# Patient Record
Sex: Female | Born: 1970
Health system: Southern US, Community
[De-identification: ages and names within clinical notes are randomized; demographics above are authoritative.]

## PROBLEM LIST (undated history)

## (undated) DIAGNOSIS — R519 Headache, unspecified: Secondary | ICD-10-CM

## (undated) DIAGNOSIS — F329 Major depressive disorder, single episode, unspecified: Secondary | ICD-10-CM

## (undated) DIAGNOSIS — Z8669 Personal history of other diseases of the nervous system and sense organs: Secondary | ICD-10-CM

## (undated) DIAGNOSIS — F32A Depression, unspecified: Secondary | ICD-10-CM

## (undated) DIAGNOSIS — F419 Anxiety disorder, unspecified: Secondary | ICD-10-CM

## (undated) DIAGNOSIS — N390 Urinary tract infection, site not specified: Secondary | ICD-10-CM

## (undated) DIAGNOSIS — G43909 Migraine, unspecified, not intractable, without status migrainosus: Secondary | ICD-10-CM

## (undated) DIAGNOSIS — R51 Headache: Secondary | ICD-10-CM

## (undated) HISTORY — DX: Headache, unspecified: R51.9

## (undated) HISTORY — DX: Headache: R51

## (undated) HISTORY — DX: Migraine, unspecified, not intractable, without status migrainosus: G43.909

## (undated) HISTORY — DX: Urinary tract infection, site not specified: N39.0

---

## 2006-09-26 ENCOUNTER — Encounter: Payer: Self-pay | Admitting: Maternal & Fetal Medicine

## 2006-11-21 ENCOUNTER — Encounter: Payer: Self-pay | Admitting: Maternal & Fetal Medicine

## 2007-02-23 ENCOUNTER — Encounter: Payer: Self-pay | Admitting: Maternal & Fetal Medicine

## 2007-04-10 ENCOUNTER — Inpatient Hospital Stay: Payer: Self-pay | Admitting: Obstetrics and Gynecology

## 2007-04-20 ENCOUNTER — Encounter: Payer: Self-pay | Admitting: Maternal & Fetal Medicine

## 2007-04-24 ENCOUNTER — Encounter: Payer: Self-pay | Admitting: Maternal & Fetal Medicine

## 2007-05-01 ENCOUNTER — Encounter: Payer: Self-pay | Admitting: Maternal & Fetal Medicine

## 2007-05-11 ENCOUNTER — Encounter: Payer: Self-pay | Admitting: Maternal & Fetal Medicine

## 2007-07-19 ENCOUNTER — Ambulatory Visit: Payer: Self-pay | Admitting: Obstetrics and Gynecology

## 2007-09-14 ENCOUNTER — Ambulatory Visit: Payer: Self-pay | Admitting: Obstetrics and Gynecology

## 2007-10-11 ENCOUNTER — Ambulatory Visit: Payer: Self-pay | Admitting: Obstetrics & Gynecology

## 2008-07-03 ENCOUNTER — Ambulatory Visit: Payer: Self-pay | Admitting: Family Medicine

## 2008-07-03 LAB — CONVERTED CEMR LAB
Basophils Absolute: 0 10*3/uL (ref 0.0–0.1)
Basophils Relative: 0 % (ref 0–1)
Hemoglobin: 12 g/dL (ref 12.0–15.0)
Lymphocytes Relative: 26 % (ref 12–46)
Lymphs Abs: 2 10*3/uL (ref 0.7–4.0)
MCHC: 32.3 g/dL (ref 30.0–36.0)
MCV: 82.5 fL (ref 78.0–100.0)
Monocytes Absolute: 0.6 10*3/uL (ref 0.1–1.0)
Monocytes Relative: 7 % (ref 3–12)
Platelets: 265 10*3/uL (ref 150–400)
RBC: 4.51 M/uL (ref 3.87–5.11)
RDW: 14.8 % (ref 11.5–15.5)
Rh Type: POSITIVE
Rubella: 48.1 intl units/mL — ABNORMAL HIGH

## 2008-07-08 ENCOUNTER — Ambulatory Visit: Payer: Self-pay | Admitting: Obstetrics and Gynecology

## 2008-07-11 ENCOUNTER — Ambulatory Visit: Payer: Self-pay | Admitting: Obstetrics and Gynecology

## 2008-07-17 ENCOUNTER — Ambulatory Visit: Payer: Self-pay | Admitting: Obstetrics and Gynecology

## 2008-07-17 ENCOUNTER — Encounter: Payer: Self-pay | Admitting: Family Medicine

## 2008-07-17 LAB — CONVERTED CEMR LAB: Chloride: 107 meq/L (ref 96–112)

## 2008-07-22 ENCOUNTER — Ambulatory Visit (HOSPITAL_COMMUNITY): Admission: RE | Admit: 2008-07-22 | Discharge: 2008-07-22 | Payer: Self-pay | Admitting: Obstetrics & Gynecology

## 2008-07-23 ENCOUNTER — Encounter: Payer: Self-pay | Admitting: Obstetrics & Gynecology

## 2008-07-23 ENCOUNTER — Ambulatory Visit: Payer: Self-pay | Admitting: Obstetrics & Gynecology

## 2008-07-24 ENCOUNTER — Encounter: Payer: Self-pay | Admitting: Family Medicine

## 2008-08-13 ENCOUNTER — Ambulatory Visit: Payer: Self-pay | Admitting: Family Medicine

## 2008-09-10 ENCOUNTER — Ambulatory Visit: Payer: Self-pay | Admitting: Obstetrics and Gynecology

## 2008-10-08 ENCOUNTER — Ambulatory Visit: Payer: Self-pay | Admitting: Obstetrics & Gynecology

## 2008-10-11 ENCOUNTER — Ambulatory Visit (HOSPITAL_COMMUNITY): Admission: RE | Admit: 2008-10-11 | Discharge: 2008-10-11 | Payer: Self-pay | Admitting: Obstetrics and Gynecology

## 2008-11-04 ENCOUNTER — Ambulatory Visit: Payer: Self-pay | Admitting: Obstetrics & Gynecology

## 2008-12-02 ENCOUNTER — Ambulatory Visit: Payer: Self-pay | Admitting: Obstetrics & Gynecology

## 2008-12-02 LAB — CONVERTED CEMR LAB
Hemoglobin: 9 g/dL — ABNORMAL LOW (ref 12.0–15.0)
Platelets: 214 10*3/uL (ref 150–400)
RDW: 16.2 % — ABNORMAL HIGH (ref 11.5–15.5)
WBC: 7.7 10*3/uL (ref 4.0–10.5)

## 2008-12-09 ENCOUNTER — Ambulatory Visit: Payer: Self-pay | Admitting: Obstetrics & Gynecology

## 2008-12-09 LAB — CONVERTED CEMR LAB
RBC Folate: 486 ng/mL (ref 180–600)
Vitamin B-12: 149 pg/mL — ABNORMAL LOW (ref 211–911)

## 2008-12-23 ENCOUNTER — Ambulatory Visit: Payer: Self-pay | Admitting: Obstetrics and Gynecology

## 2009-01-06 ENCOUNTER — Ambulatory Visit: Payer: Self-pay | Admitting: Obstetrics and Gynecology

## 2009-01-21 ENCOUNTER — Ambulatory Visit: Payer: Self-pay | Admitting: Family Medicine

## 2009-02-03 ENCOUNTER — Ambulatory Visit: Payer: Self-pay | Admitting: Obstetrics and Gynecology

## 2009-02-03 LAB — CONVERTED CEMR LAB
AST: 17 units/L (ref 0–37)
BUN: 16 mg/dL (ref 6–23)
CO2: 20 meq/L (ref 19–32)
Calcium: 8.5 mg/dL (ref 8.4–10.5)
Chlamydia, DNA Probe: NEGATIVE
Chloride: 109 meq/L (ref 96–112)
Creatinine, Ser: 0.87 mg/dL (ref 0.40–1.20)
GC Probe Amp, Genital: NEGATIVE
Glucose, Bld: 101 mg/dL — ABNORMAL HIGH (ref 70–99)
MCHC: 29.5 g/dL — ABNORMAL LOW (ref 30.0–36.0)
Potassium: 4.4 meq/L (ref 3.5–5.3)
Total Bilirubin: 0.3 mg/dL (ref 0.3–1.2)
Uric Acid, Serum: 8.6 mg/dL — ABNORMAL HIGH (ref 2.4–7.0)

## 2009-02-04 ENCOUNTER — Encounter: Payer: Self-pay | Admitting: Obstetrics and Gynecology

## 2009-02-04 LAB — CONVERTED CEMR LAB: Trich, Wet Prep: NONE SEEN

## 2009-02-06 ENCOUNTER — Ambulatory Visit: Payer: Self-pay | Admitting: Obstetrics & Gynecology

## 2009-02-06 ENCOUNTER — Encounter: Payer: Self-pay | Admitting: Obstetrics and Gynecology

## 2009-02-06 LAB — CONVERTED CEMR LAB
Collection Interval-CRCL: 24 hr
Creatinine 24 HR UR: 777 mg/24hr (ref 700–1800)
Creatinine Clearance: 62 mL/min — ABNORMAL LOW (ref 75–115)
Protein, Ur: 1678 mg/24hr — ABNORMAL HIGH (ref 50–100)

## 2009-02-07 ENCOUNTER — Inpatient Hospital Stay (HOSPITAL_COMMUNITY): Admission: AD | Admit: 2009-02-07 | Discharge: 2009-02-10 | Payer: Self-pay | Admitting: Obstetrics & Gynecology

## 2009-02-07 ENCOUNTER — Ambulatory Visit: Payer: Self-pay | Admitting: Physician Assistant

## 2009-02-13 ENCOUNTER — Telehealth: Payer: Self-pay | Admitting: Family Medicine

## 2009-03-25 ENCOUNTER — Ambulatory Visit: Payer: Self-pay | Admitting: Family Medicine

## 2009-05-27 ENCOUNTER — Ambulatory Visit: Payer: Self-pay | Admitting: Nurse Practitioner

## 2009-08-26 ENCOUNTER — Ambulatory Visit: Payer: Self-pay | Admitting: Nurse Practitioner

## 2009-08-26 ENCOUNTER — Encounter: Payer: Self-pay | Admitting: Obstetrics and Gynecology

## 2009-08-26 LAB — CONVERTED CEMR LAB
HCT: 33.5 % — ABNORMAL LOW (ref 36.0–46.0)
MCHC: 29 g/dL — ABNORMAL LOW (ref 30.0–36.0)
MCV: 79.2 fL (ref 78.0–100.0)
RDW: 16.5 % — ABNORMAL HIGH (ref 11.5–15.5)

## 2010-08-13 LAB — COMPREHENSIVE METABOLIC PANEL
Albumin: 2.3 g/dL — ABNORMAL LOW (ref 3.5–5.2)
BUN: 13 mg/dL (ref 6–23)
CO2: 22 mEq/L (ref 19–32)
Calcium: 8.3 mg/dL — ABNORMAL LOW (ref 8.4–10.5)
Chloride: 109 mEq/L (ref 96–112)
GFR calc non Af Amer: >60 mL/min (ref >60)
Total Protein: 4.7 g/dL — ABNORMAL LOW (ref 6.0–8.3)

## 2010-08-13 LAB — CBC
HCT: 18.7 % — ABNORMAL LOW (ref 36.0–46.0)
HCT: 20 % — ABNORMAL LOW (ref 36.0–46.0)
Hemoglobin: 6 g/dL — CL (ref 12.0–15.0)
MCHC: 32 g/dL (ref 30.0–36.0)
MCHC: 32.2 g/dL (ref 30.0–36.0)
MCV: 74.9 fL — ABNORMAL LOW (ref 78.0–100.0)
Platelets: 141 10*3/uL — ABNORMAL LOW (ref 150–400)
Platelets: 151 10*3/uL (ref 150–400)
RBC: 2.47 MIL/uL — ABNORMAL LOW (ref 3.87–5.11)
RDW: 18.6 % — ABNORMAL HIGH (ref 11.5–15.5)
WBC: 10.2 10*3/uL (ref 4.0–10.5)
WBC: 8.9 10*3/uL (ref 4.0–10.5)

## 2010-08-13 LAB — RPR: RPR Ser Ql: NONREACTIVE

## 2010-09-01 ENCOUNTER — Ambulatory Visit (INDEPENDENT_AMBULATORY_CARE_PROVIDER_SITE_OTHER): Payer: BC Managed Care – PPO | Admitting: Obstetrics and Gynecology

## 2010-09-01 ENCOUNTER — Other Ambulatory Visit: Payer: Self-pay | Admitting: Obstetrics & Gynecology

## 2010-09-01 DIAGNOSIS — Z113 Encounter for screening for infections with a predominantly sexual mode of transmission: Secondary | ICD-10-CM

## 2010-09-01 DIAGNOSIS — N644 Mastodynia: Secondary | ICD-10-CM

## 2010-09-01 DIAGNOSIS — Z1272 Encounter for screening for malignant neoplasm of vagina: Secondary | ICD-10-CM

## 2010-09-01 DIAGNOSIS — Z01419 Encounter for gynecological examination (general) (routine) without abnormal findings: Secondary | ICD-10-CM

## 2010-09-02 NOTE — Assessment & Plan Note (Signed)
Whitney [name], Whitney [name]            ACCOUNT NO.:  [id number]  MEDICAL RECORD NO.:  [id number]           PATIENT TYPE:  LOCATION:  [facility] at [facility]           FACILITY:  PHYSICIAN:  Jaynie Collins, MD          DATE OF BIRTH:  DATE OF SERVICE:                                 CLINIC NOTE  REASON FOR VISIT:  Annual examination.  Whitney [name] is a 40 year old gravida 3, para 2-0-1-2 with last menstrual period of August 04, 2010 here today for her annual examination.  The patient is complaining of left breast tenderness for a few days.  No masses were palpated.  No abnormal nipple drainage or any other symptoms.  She does not have any gynecologic symptoms.  PAST OB/GYN HISTORY:  The patient has had 2 term vaginal deliveries and 1 TAB.  She did have a history of preeclampsia with her first pregnancy and her second pregnancy.  The patient does not have any history of sexually transmitted infections or cervical dysplasia.  Her last Pap smear was in March 2010.  PAST MEDICAL HISTORY:  Migraines with aura, depression.  PAST SURGICAL HISTORY:  TAB.  MEDICATIONS: 1. Zoloft 50 mg daily. 2. Relpax 40 mg daily. 3. Progesterone 2 weeks a month after her cycle. 4. B12, calcium, iron, and fish oil.  ALLERGIES:  No known drug allergies.  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  She denies any tobacco, alcohol, or illicit drug use.  FAMILY HISTORY:  Mother was diagnosed with breast cancer at age 78 and passed away at age 91.  Maternal grandmother passed away of stomach cancer.  Maternal aunts passed away of lung cancer.  No other family history of any other neoplasia or conditions.  REVIEW OF SYSTEMS:  Negative as mentioned above.  Of note, the patient is not on any current contraception as her husband did undergo a vasectomy after the birth of their second child.  PHYSICAL EXAMINATION:  VITALS:  Pulse 78, blood pressure 111/73, weight 134 pounds, height  5 feet 9 inches. GENERAL:  No apparent distress. HEENT:  Normocephalic, atraumatic. NECK:  Supple.  Normal thyroid. LUNGS:  Clear to auscultation bilaterally. HEART:  Regular rate and rhythm. BREASTS:  Symmetric in size, soft.  The patient does have some focal tenderness around 1 o'clock on the left breast.  No abnormal masses palpated.  No lymphadenopathy or abnormal nipple drainage.  Normal breast exam on the patient's right side. ABDOMEN:  Soft, nontender, nondistended. EXTREMITIES:  No cyanosis, clubbing, or edema. PELVIC:  Normal external female genitalia.  Pink and rugated vagina.  No lesions seen.  Normal cervix.  Pap smear was obtained.  On bimanual exam, the patient has normal-sized uterus, retroverted.  No abnormal adnexal masses or tenderness and no uterine tenderness.  ASSESSMENT/PLAN:  The patient is a 40 year old gravida 3, para 2-0-1-2 here for yearly examination.  A Pap smear was done and automatic HPV test will be done from a Pap smear sample.  Breast examination reveals focal tenderness on the patient's left side.  [facility] this breast pain.  The patient is otherwise without any  gynecologic complaints.  She was placed on a progesterone pills by [facility].  Because she was feeling fatigued and lightheaded, she has self discontinued that at this point because she said she started itching after taking a progesterone. As of now, there is no indication for any hormonal therapy and she was told to tell us if she had any abnormal bleeding or any other gynecologic concerns but as of now she does not have to take the progesterone pills.  The patient is okay with that.  She was told to call or come back in for any further gynecologic concerns.          ______________________________ Jaynie Collins, MD    UA/MEDQ  D:  09/01/2010  T:  09/02/2010  Job:  [id number]

## 2010-09-07 ENCOUNTER — Ambulatory Visit
Admission: RE | Admit: 2010-09-07 | Discharge: 2010-09-07 | Disposition: A | Discharge status: Home / Self Care | Payer: BC Managed Care – PPO | Source: Ambulatory Visit | Attending: Obstetrics & Gynecology | Admitting: Obstetrics & Gynecology

## 2010-09-07 ENCOUNTER — Other Ambulatory Visit: Payer: Self-pay | Admitting: Obstetrics & Gynecology

## 2010-09-07 DIAGNOSIS — N644 Mastodynia: Secondary | ICD-10-CM

## 2010-09-22 NOTE — Assessment & Plan Note (Signed)
NAMETERRIKA, Whitney [name]            ACCOUNT NO.:  [id number]   MEDICAL RECORD NO.:  [id number]          PATIENT TYPE:  POB   LOCATION:  [facility] at [facility]         FACILITY:  [facility]   PHYSICIAN:  Scheryl Darter, MD       DATE OF BIRTH:  [birth year]   DATE OF SERVICE:  05/27/2009                                  CLINIC NOTE   The patient comes to the office today for consultation with her migraine  headaches.  The patient is a well-known patient to this practice.  She  was diagnosed with migraines when she was 40 years old.  She does not  have aura with her migraines.  Her headaches are typically located in  her right temple, and on monthly period she has no severe headaches.  She has 4 moderates and 2 mild.  She is currently taking Zomig which  works very well for her.  Her issue is that her Zomig has gone up to 130  dollars per prescription which is unaffordable to her.  We did talk  about prevention.  She does not feel that her headaches warrant  prevention at this time.  The patient recently had a baby, she is about  67 months old.  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  Her life is rather busy and she is not  sleeping fully through the night.  She does feel that this increased  stress has caused her headaches to be somewhat more recently.   PHYSICAL EXAMINATION:  VITAL SIGNS:  Blood pressure is 113/69, pulse is  67, weight 141.  GENERAL:  Well-developed, well-nourished 40 year old Caucasian female in  no acute distress.  HEENT:  Head is normocephalic and atraumatic.  CARDIAC:  Regular rate and rhythm.  LUNGS:  Clear bilaterally.  NEUROLOGIC:  The patient is alert, oriented.  She is well coordinated.  She has good muscle strength, good muscle coordination, good reflexes.   ASSESSMENT:  Migraine without aura.   PLAN:  We basically discussed switching her triptan over either to  Imitrex or to Relpax.  She was concerned about the Imitrex  causing her  to have side effects.  She was given a prescription for Imitrex 100 mg 1  p.o. p.r.n. migraine #9 with p.r.n. refills.  If she does have side  effects, she was also given a prescription for Relpax 40 mg 1 p.o.  p.r.n. migraine #9 with p.r.n. refills as well as a sample of that.  She  can use which ever one does not give her significant side effects.  The  patient will return in 1 year or on a p.r.n. basis.      Remonia Richter, NP    ______________________________  Scheryl Darter, MD    LR/MEDQ  D:  05/27/2009  T:  05/28/2009  Job:  [id number]

## 2010-09-22 NOTE — Assessment & Plan Note (Signed)
Whitney [name], Whitney [name]            ACCOUNT NO.:  [id number]   MEDICAL RECORD NO.:  [id number]          PATIENT TYPE:  POB   LOCATION:  [facility] at [facility]         FACILITY:  [facility]   PHYSICIAN:  Tinnie Gens, MD        DATE OF BIRTH:  Dec 14, 1970   DATE OF SERVICE:  03/25/2009                                  CLINIC NOTE   CHIEF COMPLAINT:  Postpartum check.   HISTORY OF PRESENT ILLNESS:  The patient is a normal 40 year old gravida  3, para 2 who is 6 weeks postpartum from vaginal delivery of a 37-week  infant.  She was induced for preeclampsia.  Postpartum, her blood  pressures have been fine.  She has had increasing problems with  depression and sadness.  She has cried a lot.  She is having trouble  with sleeping as her baby is not sleeping very well at all, waking every  3 hours to feed.  She was nursing.  She is trying to transition to  bottles.  At this point she is interested in going back to work possibly  this week.  The patient is currently on Zoloft 50 mg p.o. daily and  wonders if this could be increased.  She also has very little interest  in sex at this time and wonders if the Zoloft is a contributing factor  to that.  Her husband has an appointment for vasectomy.  This has  already been scheduled.  They are going to use condoms.  She has not  been sexually active up to this point.   PHYSICAL EXAMINATION:  VITAL SIGNS:  As noted in the chart.  Blood  pressure is 111/73.  GENERAL:  She is a well-developed, well-nourished woman, in no acute  distress.  GU:  Normal external female genitalia.  BUS normal.  Vagina pink and  rugated.  Cervix is parous without lesion.  Uterus is small, anteverted.  No adnexal mass or tenderness.   IMPRESSION:  Normal postpartum check, postpartum depression.   PLAN:  1. Increase her Zoloft to 100 mg p.o. daily.  If she is not better in      6-8 weeks come back in.  2. If her sexual drive does not improve with improvement of sleep,  going back to work and improvement in mood, we would consider      changing to Wellbutrin.  3. Continue condoms until vasectomy is complete.  4. She is to return in March for a Pap smear.           ______________________________  Tinnie Gens, MD     TP/MEDQ  D:  03/25/2009  T:  03/26/2009  Job:  [id number]

## 2010-09-22 NOTE — Assessment & Plan Note (Signed)
[name], [name]            ACCOUNT NO.:  [id number]   MEDICAL RECORD NO.:  [id number]          PATIENT TYPE:  POB   LOCATION:  [facility] at [facility]         FACILITY:  [facility]   PHYSICIAN:  Jaynie Collins, MD     DATE OF BIRTH:  August 16, 1970   DATE OF SERVICE:  08/26/2009                                  CLINIC NOTE   The patient comes to the office today for her yearly physical exam.  The  patient denies any complaints other than being tired with a newborn and  a 39-year-old.  She is sleeping somewhat better now, the newborn is  sleeping through the night.  But that 39-year-old was off on up at night.  Otherwise; the patient is doing well.  The patient did have a severe  anemia in September 2010.  Her hemoglobin was 7.6 and her hematocrit was  25.8.  At that point, she was put on iron.  She has completed with the  iron tablets, Colace, and over-the-counter B12 that was prescribed.  She  has not had her blood repeated since then.   ALLERGIES:  No known allergies.   First day of the last menstrual period was March 27.  Last Pap smear was  July 23, 2008.   PHYSICAL EXAMINATION:  VITAL SIGNS:  Blood pressure is 105/65, weight is  139, height is 5 feet 9 inches, pulse is 64.  GENERAL:  Well-developed, well-nourished 40 year old Caucasian female,  in no acute distress.  HEENT:  Pupils equal and reactive.  Thyroid is not enlarged.  There are  no nodules present.  CARDIAC:  Regular rate and rhythm.  LUNGS:  Clear bilaterally.  BREASTS:  Symmetrical.  There is no skin dimpling.  There are no mass  appreciated.  ABDOMEN:  Soft, nontender.  No organomegaly.  GENITALIA:  Externally, there is a slight amount of redness on the  labia.  Vaginal, there is a small amount of white thick discharge.  Cervix is without lesion.  Bimanual exam, no cervical motion tenderness.  No adnexal mass.  EXTREMITIES:  Warm and dry.   ASSESSMENT:  Yearly physical exam.  The patient had a normal Pap smear  last year and has not had an abnormal in her lifetime.  We will skip  this year for Pap smear.  She declined STD screening.  She will have her  Zoloft 50 mg 1 p.o. daily #30 with p.r.n. refills, refilled.  We will  also check a CBC to ascertain her anemia status.  She will call the  office in 1 week if she has not heard from these results.  Otherwise;  she will come back in 1 year or as needed.      Whitney Richter, NP    ______________________________  Jaynie Collins, MD   LR/MEDQ  D:  08/26/2009  T:  08/26/2009  Job:  [id number]

## 2010-09-25 NOTE — Assessment & Plan Note (Signed)
Whitney [name], Whitney [name]            ACCOUNT NO.:  [id number]   MEDICAL RECORD NO.:  [id number]          PATIENT TYPE:  POB   LOCATION:  [facility] at [facility]         FACILITY:  [facility]   PHYSICIAN:  Allie Bossier, MD        DATE OF BIRTH:  07-Mar-1971   DATE OF SERVICE:                                  CLINIC NOTE   [name] is a 40 year old married white G2, P1, A84 with a 17-month-old  son named Educational psychologist.  She is here to establish Gynecology Care.  She is up-  to-date on her Pap smear as it was done in January of 2009.  She had an  ultrasound of her left breast last month as well.  She needs a refill on  her Zoloft and wanted to change doctors.   PAST MEDICAL HISTORY:  1. Depression.  2. Migraine.   REVIEW OF SYSTEMS:  She has been married for the last year.  She has  been monogamous for the last 5 years.  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  She uses condoms.  She has tried oral contraceptive  pills for 9 months postpartum, but had nausea with Yasmin and prefers  condoms.  Her periods are monthly, lasting about 5 days and without  excessive pain with regard to her left breast.  She has seen a surgeon  named Dr. Doristine Counter and had a normal ultrasound.   PAST SURGICAL HISTORY:  She had an elective AB turned out to [facility].   FAMILY HISTORY:  Positive for breast cancer but negative for GYN and  colon malignancies.   ALLERGIES:  She is not allergic to latex.  No know drug allergies.   SOCIAL HISTORY:  Negative for tobacco, alcohol, or drug use.   PHYSICAL EXAM:  Her weight is 141 pounds, height is 5 feet 8 inch, blood  pressure 112/70, and pulse is 65.  HEENT: Normal.  BREAST: She has bilateral fibrocystic changes, but there is no discrete  masses on either side.   ASSESSMENT/PLAN:  1. Establishment of care.  2. Depression.  TSH has not been checked for her, so I will do that.      I will continue her Zoloft at a 100 mg a day and consider weaning      in approximately 6  months.  With regard to her breast, I have      recommended annual mammograms and monthly self-breast exam.     Allie Bossier, MD    MCD/MEDQ  D:  10/19/2007  T:  10/19/2007  Job:  [phone]

## 2011-01-07 ENCOUNTER — Telehealth: Payer: Self-pay

## 2011-01-07 NOTE — Telephone Encounter (Signed)
PATIENT CALLED ABOUT HAVING HER RELPAX PRE AUTHORIZED. HER PHARMACY TOLD HER THAT HER INSURANCE ONLY APPROVES 4 PILLS AND THAT WE NEED TO APPEAL IT SO SHE CAN GET 8 PILLS INSTEAD. THIS IS THE PHONE # TO CALL [phone]. PLEASE CALL AS SOON AS THIS IS TAKEN CARE OFF.

## 2011-01-25 NOTE — Telephone Encounter (Signed)
Spoke with patients insurance and they stated that they would not prior authorize additional medication past the amount of four tablets.  The only way to get this done would be to write a letter of appeal.  Sent to [employer]   [address] [address]  [place]  [id number].  For her Relpax 40 mg

## 2012-08-28 ENCOUNTER — Ambulatory Visit: Payer: Self-pay | Admitting: Obstetrics and Gynecology

## 2013-02-28 ENCOUNTER — Ambulatory Visit: Payer: Self-pay | Admitting: Family Medicine

## 2013-09-18 ENCOUNTER — Ambulatory Visit: Payer: Self-pay | Admitting: Family Medicine

## 2013-12-24 LAB — HM PAP SMEAR: HM Pap smear: NORMAL

## 2014-08-28 ENCOUNTER — Emergency Department
Admit: 2014-08-28 | Disposition: A | Discharge status: Home / Self Care | Payer: Self-pay | Admitting: Emergency Medicine

## 2014-08-28 LAB — TROPONIN I: Troponin-I: <0.03 ng/mL

## 2014-08-28 LAB — CBC
HCT: 34.7 % — ABNORMAL LOW (ref 35.0–47.0)
HGB: 10.6 g/dL — ABNORMAL LOW (ref 12.0–16.0)
MCH: 23.4 pg — ABNORMAL LOW (ref 26.0–34.0)
MCHC: 30.5 g/dL — AB (ref 32.0–36.0)
MCV: 77 fL — ABNORMAL LOW (ref 80–100)
Platelet: 263 10*3/uL (ref 150–440)
RBC: 4.5 10*6/uL (ref 3.80–5.20)
RDW: 16.3 % — AB (ref 11.5–14.5)
WBC: 5.1 10*3/uL (ref 3.6–11.0)

## 2014-08-28 LAB — BASIC METABOLIC PANEL
Anion Gap: 7 (ref 7–16)
BUN: 20 mg/dL
CALCIUM: 9.1 mg/dL
CO2: 27 mmol/L
Chloride: 107 mmol/L
Creatinine: 0.61 mg/dL
GLUCOSE: 150 mg/dL — AB
POTASSIUM: 3.2 mmol/L — AB
Sodium: 141 mmol/L

## 2014-09-09 LAB — HM MAMMOGRAPHY: HM MAMMO: NORMAL

## 2014-12-08 ENCOUNTER — Emergency Department: Payer: BLUE CROSS/BLUE SHIELD

## 2014-12-08 ENCOUNTER — Emergency Department
Admission: EM | Admit: 2014-12-08 | Discharge: 2014-12-08 | Disposition: A | Discharge status: Home / Self Care | Payer: BLUE CROSS/BLUE SHIELD | Attending: Emergency Medicine | Admitting: Emergency Medicine

## 2014-12-08 ENCOUNTER — Encounter: Payer: Self-pay | Admitting: *Deleted

## 2014-12-08 DIAGNOSIS — Z3202 Encounter for pregnancy test, result negative: Secondary | ICD-10-CM | POA: Insufficient documentation

## 2014-12-08 DIAGNOSIS — R51 Headache: Secondary | ICD-10-CM | POA: Diagnosis not present

## 2014-12-08 DIAGNOSIS — R202 Paresthesia of skin: Secondary | ICD-10-CM | POA: Insufficient documentation

## 2014-12-08 DIAGNOSIS — F410 Panic disorder [episodic paroxysmal anxiety] without agoraphobia: Secondary | ICD-10-CM | POA: Diagnosis not present

## 2014-12-08 DIAGNOSIS — F419 Anxiety disorder, unspecified: Secondary | ICD-10-CM

## 2014-12-08 DIAGNOSIS — R42 Dizziness and giddiness: Secondary | ICD-10-CM | POA: Diagnosis present

## 2014-12-08 HISTORY — DX: Depression, unspecified: F32.A

## 2014-12-08 HISTORY — DX: Personal history of other diseases of the nervous system and sense organs: Z86.69

## 2014-12-08 HISTORY — DX: Major depressive disorder, single episode, unspecified: F32.9

## 2014-12-08 HISTORY — DX: Anxiety disorder, unspecified: F41.9

## 2014-12-08 LAB — CBC
HEMATOCRIT: 42.7 % (ref 35.0–47.0)
HEMOGLOBIN: 14 g/dL (ref 12.0–16.0)
MCH: 29.1 pg (ref 26.0–34.0)
MCHC: 32.9 g/dL (ref 32.0–36.0)
MCV: 88.4 fL (ref 80.0–100.0)
Platelets: 207 10*3/uL (ref 150–440)
RBC: 4.82 MIL/uL (ref 3.80–5.20)
RDW: 15.8 % — AB (ref 11.5–14.5)
WBC: 5.2 10*3/uL (ref 3.6–11.0)

## 2014-12-08 LAB — URINALYSIS COMPLETE WITH MICROSCOPIC (ARMC ONLY)
Bacteria, UA: NONE SEEN
Bilirubin Urine: NEGATIVE
Glucose, UA: NEGATIVE mg/dL
Ketones, ur: NEGATIVE mg/dL
NITRITE: NEGATIVE
PH: 5 (ref 5.0–8.0)
PROTEIN: NEGATIVE mg/dL
Specific Gravity, Urine: 1.015 (ref 1.005–1.030)

## 2014-12-08 LAB — COMPREHENSIVE METABOLIC PANEL
ALBUMIN: 4.2 g/dL (ref 3.5–5.0)
ALT: 10 U/L — AB (ref 14–54)
AST: 13 U/L — AB (ref 15–41)
Alkaline Phosphatase: 43 U/L (ref 38–126)
Anion gap: 6 (ref 5–15)
BILIRUBIN TOTAL: 0.8 mg/dL (ref 0.3–1.2)
BUN: 16 mg/dL (ref 6–20)
CO2: 25 mmol/L (ref 22–32)
Calcium: 8.9 mg/dL (ref 8.9–10.3)
Chloride: 107 mmol/L (ref 101–111)
Creatinine, Ser: 0.65 mg/dL (ref 0.44–1.00)
GFR calc Af Amer: >60 mL/min (ref >60)
Glucose, Bld: 108 mg/dL — ABNORMAL HIGH (ref 65–99)
Potassium: 3.8 mmol/L (ref 3.5–5.1)
Sodium: 138 mmol/L (ref 135–145)
Total Protein: 7.2 g/dL (ref 6.5–8.1)

## 2014-12-08 LAB — POCT PREGNANCY, URINE: Preg Test, Ur: NEGATIVE

## 2014-12-08 LAB — TROPONIN I: Troponin I: <0.03 ng/mL (ref <0.031)

## 2014-12-08 NOTE — ED Notes (Signed)
Patient transported to CT 

## 2014-12-08 NOTE — ED Notes (Signed)
Pt/husband have been told by MD that there is no brain tumor, vital signs are good, labs are good, no indication of diabetes or other illness.  Recommendation at this time is for an interview with TTS for evaluation, leading to therapy and a drug regimen.  Pt/husband waiting for the interview.

## 2014-12-08 NOTE — ED Provider Notes (Signed)
[facility] Emergency Department Provider Note  ____________________________________________  Time seen: On arrival  I have reviewed the triage vital signs and the nursing notes.   HISTORY  Chief Complaint Panic Attack and Dizziness    HPI Whitney [name] is a 44 y.o. female who presents with complaints of anxiety. 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 In this morning she got very anxious. She reports a long history of migraines and she always thinks that she has a brain tumor. She is concerned that she may have diabetes as well because she has had tingling on the top of her left foot occasionally but not currently. She denies suicidality, no homicidal ideation.     Past Medical History  Diagnosis Date  . Hx of migraine headaches   . Depression   . Anxiety     There are no active problems to display for this patient.   No past surgical history on file.  No current outpatient prescriptions on file.  Allergies Review of patient's allergies indicates no known allergies.  No family history on file.  Social History History  Substance Use Topics  . Smoking status: Never Smoker   . Smokeless tobacco: Not on file  . Alcohol Use: No    Review of Systems  Constitutional: Negative for fever. Eyes: Negative for visual changes. ENT: Negative for sore throat Cardiovascular: Negative for chest pain. Respiratory: Negative for shortness of breath. Gastrointestinal: Negative for abdominal pain, vomiting and diarrhea. Genitourinary: Negative for dysuria. Musculoskeletal: Negative for back pain. Skin: Negative for rash. Neurological: Positive for chronic headaches Psychiatric: Positive for anxiety    ____________________________________________   PHYSICAL EXAM:  VITAL SIGNS: ED Triage Vitals  Enc Vitals Group     BP 12/08/14 0958 119/74 mmHg     Pulse Rate  12/08/14 0958 90     Resp 12/08/14 1120 15     Temp 12/08/14 0958 98.5 F (36.9 C)     Temp Source 12/08/14 0958 Oral     SpO2 12/08/14 0958 98 %     Weight 12/08/14 0958 145 lb (65.772 kg)     Height 12/08/14 0958 5\' 8"  (1.727 m)     Head Cir --      Peak Flow --      Pain Score --      Pain Loc --      Pain Edu? --      Excl. in GC? --      Constitutional: Alert and oriented. Well appearing and in no distress. Eyes: Conjunctivae are normal.  ENT   Head: Normocephalic and atraumatic.   Mouth/Throat: Mucous membranes are moist. Cardiovascular: Normal rate, regular rhythm. Normal and symmetric distal pulses are present in all extremities. No murmurs, rubs, or gallops. Respiratory: Normal respiratory effort without tachypnea nor retractions. Breath sounds are clear and equal bilaterally.  Gastrointestinal: Soft and non-tender in all quadrants. No distention. There is no CVA tenderness. Genitourinary: deferred Musculoskeletal: Nontender with normal range of motion in all extremities. No lower extremity tenderness nor edema. Neurologic:  Normal speech and language. No gross focal neurologic deficits are appreciated. Skin:  Skin is warm, dry and intact. No rash noted. Psychiatric: Mood and affect are normal. Patient exhibits appropriate insight and judgment.  ____________________________________________    LABS (pertinent positives/negatives)  Labs Reviewed  CBC - Abnormal; Notable for the following:    RDW 15.8 (*)  All other components within normal limits  COMPREHENSIVE METABOLIC PANEL - Abnormal; Notable for the following:    Glucose, Bld 108 (*)    AST 13 (*)    ALT 10 (*)    All other components within normal limits  URINALYSIS COMPLETEWITH MICROSCOPIC ([facility] ONLY) - Abnormal; Notable for the following:    Color, Urine YELLOW (*)    APPearance CLEAR (*)    Hgb urine dipstick 1+ (*)    Leukocytes, UA TRACE (*)    Squamous Epithelial / LPF 6-30 (*)    All  other components within normal limits  TROPONIN I  POCT PREGNANCY, URINE    ____________________________________________   EKG  None  ____________________________________________    RADIOLOGY I have personally reviewed any xrays that were ordered on this patient: CT head is unremarkable  ____________________________________________   PROCEDURES  Procedure(s) performed: none  Critical Care performed: none  ____________________________________________   INITIAL IMPRESSION / ASSESSMENT AND PLAN / ED COURSE  Pertinent labs & imaging results that were available during my care of the patient were reviewed by me and considered in my medical decision making (see chart for details).  Patient with benign exam. It sounds like she is suffering from generalized anxiety disorder likely benefit from CBT versus outpatient follow-up with psychiatry. I will consult TTS to try to arrange outpatient follow-up  ____________________________________________   FINAL CLINICAL IMPRESSION(S) / ED DIAGNOSES  Final diagnoses:  Anxiety disorder, unspecified anxiety disorder type     Jene Every, MD 12/08/14 1547

## 2014-12-08 NOTE — Discharge Instructions (Signed)

## 2014-12-08 NOTE — ED Notes (Signed)
Following CT, pt has been returned to Rm [facility]

## 2014-12-08 NOTE — ED Notes (Signed)
Panic attacks began in April, 2016.  The only unusual thing that happened in April was that the pt's father was abandoned by his wife; pt's stepmother, and he was put in Memory Care (pt's father has Alzheimer's).  Pt and husband were only given two days notice.  Pt and husband began a difficult process of attempting to get custody of pt's father so they could care for him.  It was during this time that panic attacks began.  Also, pt was concerned about sisters, one of whom was pregnant and one with whom pt was having an altercation, now resolved.  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  The medication helped at the time, but appears to be no longer working.  Pt c/o migraines for years and tingling in her left foot. Pt fears she has a brain tumor and neuropathy from diabetes.

## 2014-12-08 NOTE — ED Notes (Signed)
Well appearing female, states she is having panic attacks. Pt is always thinking something is wrong. Very nervous, very anxious. Pt continues to state she has a brain tumor and she has been having a lot of headaches. Pt denies SI or HI at this time. Pt feels like she is in a fog and is very emotional.

## 2014-12-08 NOTE — ED Notes (Signed)
Pt has been assessed by MD.

## 2014-12-25 ENCOUNTER — Encounter: Payer: Self-pay | Admitting: Nurse Practitioner

## 2014-12-25 ENCOUNTER — Ambulatory Visit (INDEPENDENT_AMBULATORY_CARE_PROVIDER_SITE_OTHER): Payer: BLUE CROSS/BLUE SHIELD | Admitting: Nurse Practitioner

## 2014-12-25 VITALS — BP 116/68 | HR 65 | Temp 98.6°F | Resp 14 | Ht 68.0 in | Wt 143.8 lb

## 2014-12-25 DIAGNOSIS — F418 Other specified anxiety disorders: Secondary | ICD-10-CM | POA: Insufficient documentation

## 2014-12-25 DIAGNOSIS — G43909 Migraine, unspecified, not intractable, without status migrainosus: Secondary | ICD-10-CM | POA: Insufficient documentation

## 2014-12-25 DIAGNOSIS — G43809 Other migraine, not intractable, without status migrainosus: Secondary | ICD-10-CM

## 2014-12-25 DIAGNOSIS — Z7189 Other specified counseling: Secondary | ICD-10-CM | POA: Diagnosis not present

## 2014-12-25 DIAGNOSIS — Z0000 Encounter for general adult medical examination without abnormal findings: Secondary | ICD-10-CM | POA: Insufficient documentation

## 2014-12-25 DIAGNOSIS — Z7689 Persons encountering health services in other specified circumstances: Secondary | ICD-10-CM

## 2014-12-25 MED ORDER — SUMATRIPTAN SUCCINATE 50 MG PO TABS: 50.0000 mg | ORAL_TABLET | Freq: Once | ORAL | Status: DC

## 2014-12-25 NOTE — Progress Notes (Signed)
Patient ID: Whitney [name], female    DOB: 12/21/1970  Age: 44 y.o. MRN: [id number]  CC: Establish Care   HPI [name] presents for establishing care and CC of headaches and anxiety.   1) New pt info:   Immunizations- Unknown   Mammogram- 2016; gets every 6 months due to mother's hx of breast cancer  Pap- 2015 wnl she reports  Eye Exam- 3 years ago  2) Chronic Problems-  Depression/Anxiety- Stable currently on Zoloft and Xanax- pt is set up with Dr. Maryruth Bun and has a counselor she has seen once already. Pt was in the ER twice (April and July 2016) with panic attacks.   Migraines-/Frequent Headaches- Has [facility] in the past; treatments included preventative medications- felt bad and they wanted to do a nerve block she reports, but could not afford it.   3) Acute Problems-  Zoloft- May need refill; wants to discuss whether there is something else she can take. Pt reports she does not feel it is helpful.   History Iyonna has a past medical history of migraine headaches; Depression; Anxiety; Frequent headaches; Migraines; and Urinary tract infection.   She has no past surgical history on file.   Her family history includes Cancer in her maternal aunt and mother; Diabetes in her father.She reports that she has never smoked. She has never used smokeless tobacco. She reports that she does not drink alcohol or use illicit drugs.  No outpatient prescriptions prior to visit.   No facility-administered medications prior to visit.    ROS Review of Systems  Constitutional: Negative for fever, chills, diaphoresis and fatigue.  Respiratory: Negative for chest tightness, shortness of breath and wheezing.   Cardiovascular: Negative for chest pain, palpitations and leg swelling.  Gastrointestinal: Negative for nausea, vomiting and diarrhea.  Skin: Negative for rash.  Neurological: Positive for headaches. Negative for dizziness, weakness and numbness.   Psychiatric/Behavioral: The patient is nervous/anxious.     Objective:  BP 116/68 mmHg  Pulse 65  Temp(Src) 98.6 F (37 C)  Resp 14  Ht  (1.727 m)  Wt 143 lb 12.8 oz (65.227 kg)  BMI 21.87 kg/m2  SpO2 98%  LMP 12/12/2014  Physical Exam  Constitutional: She is oriented to person, place, and time. She appears well-developed and well-nourished. No distress.  HENT:  Head: Normocephalic and atraumatic.  Right Ear: External ear normal.  Left Ear: External ear normal.  Cardiovascular: Normal rate, regular rhythm and normal heart sounds.   Pulmonary/Chest: Effort normal and breath sounds normal. No respiratory distress. She has no wheezes. She has no rales. She exhibits no tenderness.  Neurological: She is alert and oriented to person, place, and time. No cranial nerve deficit. She exhibits normal muscle tone. Coordination normal.  Skin: Skin is warm and dry. No rash noted. She is not diaphoretic.  Psychiatric: She has a normal mood and affect. Her behavior is normal. Judgment and thought content normal.  Patient making good eye contact, patient seems slightly anxious today    Assessment & Plan:   There are no diagnoses linked to this encounter. I have changed Ms. Eplin's SUMAtriptan. I am also having her maintain her Iron, Azelastine-Fluticasone, sertraline, and ALPRAZolam.  Meds ordered this encounter  Medications  . DISCONTD: SUMAtriptan (IMITREX) 50 MG tablet    Sig: Take 50 mg by mouth once. May repeat in 2 hours if headache persists or recurs.  . Ferrous Sulfate (IRON) 325 (65 FE) MG TABS  Sig: Take 325 mg by mouth daily.  . Azelastine-Fluticasone (DYMISTA) 137-50 MCG/ACT SUSP    Sig: Place 1 spray into the nose 2 (two) times daily.  . sertraline (ZOLOFT) 100 MG tablet    Sig: Take 100 mg by mouth daily.  Marland Kitchen ALPRAZolam (XANAX) 0.25 MG tablet    Sig: Take 0.25 mg by mouth 2 (two) times daily as needed for anxiety.  . SUMAtriptan (IMITREX) 50 MG tablet    Sig:  Take 1 tablet (50 mg total) by mouth once. May repeat in 2 hours if headache persists or recurs.    Dispense:  10 tablet    Refill:  1    Order Specific Question:  Supervising Provider    Answer:  Sherlene Shams [[id number]]     Follow-up: Return in about 6 weeks (around 02/05/2015) for Anxiety and Headaches.

## 2014-12-25 NOTE — Assessment & Plan Note (Signed)
Chronic problem is stable. Patient currently on Imitrex. Patient has been seen at [facility] facility but chose not to go through with treatment due to cost. Will follow up in 4 weeks

## 2014-12-25 NOTE — Patient Instructions (Addendum)
Welcome to [facility]! We will follow up with you in 1 month.   Please let us know if we need to refill your zoloft for 1 month.

## 2014-12-25 NOTE — Assessment & Plan Note (Addendum)
Worsening. Will continue Xanax and Zoloft 100 mg. No refills needed today. Patient was asked to continue Zoloft for 1 more month until set up with psychiatry. Patient is set up with Dr. Maryruth Bun and is seeing counseling currently. She has been in the hospital 2 times in 6 months for anxiety and panic attacks. Follow-up in 4 weeks

## 2014-12-25 NOTE — Assessment & Plan Note (Signed)
Discussed acute and chronic issues. Reviewed health maintenance measures, PFSHx, and immunizations. Obtain records from previous facility.   

## 2015-01-27 ENCOUNTER — Encounter: Payer: Self-pay | Admitting: Nurse Practitioner

## 2015-01-27 ENCOUNTER — Ambulatory Visit (INDEPENDENT_AMBULATORY_CARE_PROVIDER_SITE_OTHER): Payer: BLUE CROSS/BLUE SHIELD | Admitting: Nurse Practitioner

## 2015-01-27 VITALS — BP 98/80 | HR 93 | Temp 99.5°F | Resp 14 | Ht 68.0 in | Wt 142.2 lb

## 2015-01-27 DIAGNOSIS — J069 Acute upper respiratory infection, unspecified: Secondary | ICD-10-CM | POA: Diagnosis not present

## 2015-01-27 DIAGNOSIS — R300 Dysuria: Secondary | ICD-10-CM | POA: Diagnosis not present

## 2015-01-27 LAB — POCT URINALYSIS DIPSTICK
Bilirubin, UA: NEGATIVE
Glucose, UA: NEGATIVE
Ketones, UA: NEGATIVE
Nitrite, UA: NEGATIVE
PROTEIN UA: NEGATIVE
Spec Grav, UA: >=1.03
Urobilinogen, UA: 0.2
pH, UA: 5

## 2015-01-27 MED ORDER — CIPROFLOXACIN HCL 250 MG PO TABS: 250.0000 mg | ORAL_TABLET | Freq: Two times a day (BID) | ORAL | Status: DC

## 2015-01-27 NOTE — Patient Instructions (Addendum)
Please take the antibiotics as prescribed. We will contact you with results later this week (takes approx 3-4 days for the culture to return).  Probiotics are recommended with the antibiotic to prevent yeast infections and diarrhea.   Call if not improved by Friday.

## 2015-01-27 NOTE — Progress Notes (Signed)
Pre visit review using our clinic review tool, if applicable. No additional management support is needed unless otherwise documented below in the visit note. 

## 2015-01-27 NOTE — Progress Notes (Signed)
Patient ID: EVANGELYNN LOCHRIDGE, female    DOB: April 09, 1971  Age: 44 y.o. MRN: [id number]  CC: Urinary Tract Infection   HPI LASHONDRA VAQUERANO presents for UTI symptoms x 1 day and URI symptoms x 16 days.   1) Dysuria on Friday. Odor occasionally, no other symptoms.   2) Felt she had a sinus infection around labor day and went to UC. She reports she was told it was viral and and to take OTC medications. 16 days of feeling poorly.  Frontal headache, temperature increase.   History Leo has a past medical history of migraine headaches; Depression; Anxiety; Frequent headaches; Migraines; and Urinary tract infection.   She has no past surgical history on file.   Her family history includes Cancer in her maternal aunt and mother; Diabetes in her father.She reports that she has never smoked. She has never used smokeless tobacco. She reports that she does not drink alcohol or use illicit drugs.  Outpatient Prescriptions Prior to Visit  Medication Sig Dispense Refill  . ALPRAZolam (XANAX) 0.25 MG tablet Take 0.25 mg by mouth 2 (two) times daily as needed for anxiety.    . Azelastine-Fluticasone (DYMISTA) 137-50 MCG/ACT SUSP Place 1 spray into the nose 2 (two) times daily.    . Ferrous Sulfate (IRON) 325 (65 FE) MG TABS Take 325 mg by mouth daily.    . sertraline (ZOLOFT) 100 MG tablet Take 100 mg by mouth daily.    . SUMAtriptan (IMITREX) 50 MG tablet Take 1 tablet (50 mg total) by mouth once. May repeat in 2 hours if headache persists or recurs. 10 tablet 1   No facility-administered medications prior to visit.    ROS Review of Systems  Constitutional: Positive for fever. Negative for chills, diaphoresis and fatigue.  HENT: Positive for congestion and sinus pressure.   Respiratory: Negative for chest tightness, shortness of breath and wheezing.   Cardiovascular: Negative for chest pain, palpitations and leg swelling.  Gastrointestinal: Negative for nausea, vomiting and diarrhea.   Genitourinary: Positive for dysuria.  Skin: Negative for rash.  Neurological: Negative for dizziness, weakness, numbness and headaches.  Psychiatric/Behavioral: The patient is nervous/anxious.     Objective:  BP 98/80 mmHg  Pulse 93  Temp(Src) 99.5 F (37.5 C)  Resp 14  Ht  (1.727 m)  Wt 142 lb 3.2 oz (64.501 kg)  BMI 21.63 kg/m2  SpO2 98%  Physical Exam  Constitutional: She is oriented to person, place, and time. She appears well-developed and well-nourished. No distress.  HENT:  Head: Normocephalic and atraumatic.  Right Ear: External ear normal.  Left Ear: External ear normal.  Cardiovascular: Normal rate.  Exam reveals no gallop and no friction rub.   No murmur heard. Pulmonary/Chest: Effort normal and breath sounds normal. No respiratory distress. She has no wheezes. She has no rales. She exhibits no tenderness.  Abdominal: There is no CVA tenderness.  Neurological: She is alert and oriented to person, place, and time.  Skin: Skin is warm and dry. No rash noted. She is not diaphoretic.  Psychiatric: She has a normal mood and affect. Her behavior is normal. Judgment and thought content normal.   Assessment & Plan:   Babetta was seen today for urinary tract infection.  Diagnoses and all orders for this visit:  Dysuria -     POCT Urinalysis Dipstick -     Cancel: POCT Urinalysis Dipstick -     Urine culture -     Cancel: Urine Culture  Acute  URI  Other orders -     ciprofloxacin (CIPRO) 250 MG tablet; Take 1 tablet (250 mg total) by mouth 2 (two) times daily.   I am having Ms. Bronaugh start on ciprofloxacin. I am also having her maintain her Iron, Azelastine-Fluticasone, sertraline, ALPRAZolam, SUMAtriptan, and sertraline.  Meds ordered this encounter  Medications  . sertraline (ZOLOFT) 50 MG tablet    Sig: Take 50 mg by mouth daily.  . ciprofloxacin (CIPRO) 250 MG tablet    Sig: Take 1 tablet (250 mg total) by mouth 2 (two) times daily.    Dispense:   10 tablet    Refill:  0    Order Specific Question:  Supervising Provider    Answer:  Sherlene Shams [[id number]     Follow-up: Return if symptoms worsen or fail to improve.

## 2015-01-27 NOTE — Assessment & Plan Note (Signed)
16 days with no improvement of sinus symptoms. Cipro given for POCT urine and uri symptoms. Will follow up next week. Encouraged rest, fluids, and probiotics.

## 2015-01-27 NOTE — Assessment & Plan Note (Signed)
POCT urine possible for UTI, due to 16 days of uri symptoms will treat with cipro and obtain culture. FU prn worsening/failure to improve.

## 2015-01-29 ENCOUNTER — Ambulatory Visit: Payer: BLUE CROSS/BLUE SHIELD | Admitting: Nurse Practitioner

## 2015-01-30 LAB — URINE CULTURE: Colony Count: >=100000

## 2015-02-05 ENCOUNTER — Encounter: Payer: Self-pay | Admitting: Nurse Practitioner

## 2015-02-05 ENCOUNTER — Ambulatory Visit (INDEPENDENT_AMBULATORY_CARE_PROVIDER_SITE_OTHER): Payer: BLUE CROSS/BLUE SHIELD | Admitting: Nurse Practitioner

## 2015-02-05 VITALS — BP 98/76 | HR 74 | Temp 98.7°F | Resp 14 | Ht 68.0 in | Wt 144.4 lb

## 2015-02-05 DIAGNOSIS — Z1239 Encounter for other screening for malignant neoplasm of breast: Secondary | ICD-10-CM | POA: Diagnosis not present

## 2015-02-05 DIAGNOSIS — F418 Other specified anxiety disorders: Secondary | ICD-10-CM | POA: Diagnosis not present

## 2015-02-05 DIAGNOSIS — G43809 Other migraine, not intractable, without status migrainosus: Secondary | ICD-10-CM | POA: Diagnosis not present

## 2015-02-05 MED ORDER — SUMATRIPTAN SUCCINATE 50 MG PO TABS: 50.0000 mg | ORAL_TABLET | Freq: Once | ORAL | Status: DC

## 2015-02-05 NOTE — Patient Instructions (Addendum)
[facility]  Address: [address], [place], [place] [address]  Phone: [phone] Hours:  Monday - Thursday: 8 a.m. - 5 p.m. Friday: 8 a.m. - 3 p.m.    

## 2015-02-05 NOTE — Assessment & Plan Note (Signed)
Migraines stable to slightly improving. Pt reports only needing 2 imitrex last week. She gets more migraines around weather changes and menstruation.

## 2015-02-05 NOTE — Assessment & Plan Note (Addendum)
Seeing Dr. Maryruth Bun. Taking 100 + 50 mg to equal 150 mg of zoloft. Pt taking for a little over 2 weeks. Meeting with Dr. Maryruth Bun tomorrow. Pt reports she feels no different. Xanax is prn for panic attacks. She feels more anxious in the mornings. Panic attacks show as dizziness. Pt getting set up with counseling. Will follow

## 2015-02-05 NOTE — Progress Notes (Signed)
Pre visit review using our clinic review tool, if applicable. No additional management support is needed unless otherwise documented below in the visit note. 

## 2015-02-05 NOTE — Progress Notes (Signed)
Patient ID: Whitney [name], female    DOB: [birth year]  Age: 44 y.o. MRN: [id number]  CC: Follow-up   HPI JANIRA MANDELL presents for follow up of depression/anxiety and migraines.   1) Taking Zoloft 150 mg with psychiatrist Dr. Maryruth Bun.  She is being set up for counseling with   2) Migraines- thinks menstrual cycle is triggering. Took 2 imitrex last week.   3) Declined flu vaccine   4) Requesting referral for mammogram. Mother had dense breast tissue that was thought to be breast cancer.   History Kariann has a past medical history of migraine headaches; Depression; Anxiety; Frequent headaches; Migraines; and Urinary tract infection.   She has no past surgical history on file.   Her family history includes Cancer in her maternal aunt and mother; Diabetes in her father.She reports that she has never smoked. She has never used smokeless tobacco. She reports that she does not drink alcohol or use illicit drugs.  Outpatient Prescriptions Prior to Visit  Medication Sig Dispense Refill  . ALPRAZolam (XANAX) 0.25 MG tablet Take 0.25 mg by mouth 2 (two) times daily as needed for anxiety.    . Azelastine-Fluticasone (DYMISTA) 137-50 MCG/ACT SUSP Place 1 spray into the nose 2 (two) times daily.    . Ferrous Sulfate (IRON) 325 (65 FE) MG TABS Take 325 mg by mouth daily.    . sertraline (ZOLOFT) 100 MG tablet Take 100 mg by mouth daily.    . sertraline (ZOLOFT) 50 MG tablet Take 50 mg by mouth daily.    . SUMAtriptan (IMITREX) 50 MG tablet Take 1 tablet (50 mg total) by mouth once. May repeat in 2 hours if headache persists or recurs. 10 tablet 1  . ciprofloxacin (CIPRO) 250 MG tablet Take 1 tablet (250 mg total) by mouth 2 (two) times daily. 10 tablet 0   No facility-administered medications prior to visit.    ROS Review of Systems  Constitutional: Negative for fever, chills, diaphoresis and fatigue.  Respiratory: Negative for chest tightness, shortness of breath and wheezing.    Cardiovascular: Negative for chest pain, palpitations and leg swelling.  Gastrointestinal: Negative for nausea, vomiting, diarrhea and rectal pain.  Skin: Negative for rash.  Neurological: Negative for dizziness, weakness, numbness and headaches.  Psychiatric/Behavioral: The patient is not nervous/anxious.     Objective:  BP 98/76 mmHg  Pulse 74  Temp(Src) 98.7 F (37.1 C)  Resp 14  Ht  (1.727 m)  Wt 144 lb 6.4 oz (65.499 kg)  BMI 21.96 kg/m2  SpO2 98%  Physical Exam  Constitutional: She is oriented to person, place, and time. She appears well-developed and well-nourished. No distress.  HENT:  Head: Normocephalic and atraumatic.  Right Ear: External ear normal.  Left Ear: External ear normal.  Cardiovascular: Normal rate, regular rhythm and normal heart sounds.  Exam reveals no gallop and no friction rub.   No murmur heard. Pulmonary/Chest: Effort normal and breath sounds normal. No respiratory distress. She has no wheezes. She has no rales. She exhibits no tenderness.  Neurological: She is alert and oriented to person, place, and time. No cranial nerve deficit. She exhibits normal muscle tone. Coordination normal.  Skin: Skin is warm and dry. No rash noted. She is not diaphoretic.  Psychiatric: She has a normal mood and affect. Her behavior is normal. Judgment and thought content normal.  Appearing less anxious today, making good eye contact, dressed appropriately    Assessment & Plan:   There are no diagnoses linked  to this encounter. I have discontinued Ms. Ruhe's ciprofloxacin. I am also having her maintain her Iron, Azelastine-Fluticasone, sertraline, ALPRAZolam, SUMAtriptan, and sertraline.  No orders of the defined types were placed in this encounter.     Follow-up: No Follow-up on file.

## 2015-02-11 ENCOUNTER — Encounter: Payer: Self-pay | Admitting: Nurse Practitioner

## 2015-02-12 ENCOUNTER — Other Ambulatory Visit: Payer: Self-pay | Admitting: Nurse Practitioner

## 2015-02-12 DIAGNOSIS — Z1239 Encounter for other screening for malignant neoplasm of breast: Secondary | ICD-10-CM

## 2015-02-21 ENCOUNTER — Ambulatory Visit
Admission: RE | Admit: 2015-02-21 | Discharge: 2015-02-21 | Disposition: A | Discharge status: Home / Self Care | Payer: BLUE CROSS/BLUE SHIELD | Source: Ambulatory Visit | Attending: Nurse Practitioner | Admitting: Nurse Practitioner

## 2015-02-21 DIAGNOSIS — Z1239 Encounter for other screening for malignant neoplasm of breast: Secondary | ICD-10-CM

## 2015-02-21 DIAGNOSIS — R921 Mammographic calcification found on diagnostic imaging of breast: Secondary | ICD-10-CM | POA: Insufficient documentation

## 2015-02-25 ENCOUNTER — Other Ambulatory Visit: Payer: Self-pay | Admitting: Nurse Practitioner

## 2015-02-25 ENCOUNTER — Telehealth: Payer: Self-pay | Admitting: Nurse Practitioner

## 2015-02-25 DIAGNOSIS — R921 Mammographic calcification found on diagnostic imaging of breast: Secondary | ICD-10-CM

## 2015-02-25 NOTE — Telephone Encounter (Signed)
Patient was very nervous about the mammogram results over the weekend. She reports she is trying to remain calm and is very concerned about the possible outcome of the biopsy. I reassured her that it is okay to be nervous and that if she has questions or concerns to contact me. I told her we are working on getting the order for the biopsy correct so they can schedule this. She stated she was appreciative of the call and support offered. Will follow as needed.

## 2015-03-03 ENCOUNTER — Other Ambulatory Visit: Payer: Self-pay | Admitting: Nurse Practitioner

## 2015-03-03 DIAGNOSIS — R921 Mammographic calcification found on diagnostic imaging of breast: Secondary | ICD-10-CM

## 2015-03-04 ENCOUNTER — Ambulatory Visit
Admission: RE | Admit: 2015-03-04 | Discharge: 2015-03-04 | Disposition: A | Discharge status: Home / Self Care | Payer: BLUE CROSS/BLUE SHIELD | Source: Ambulatory Visit | Attending: Nurse Practitioner | Admitting: Nurse Practitioner

## 2015-03-04 DIAGNOSIS — R921 Mammographic calcification found on diagnostic imaging of breast: Secondary | ICD-10-CM | POA: Diagnosis not present

## 2015-03-04 HISTORY — PX: BREAST BIOPSY: SHX20

## 2015-03-06 ENCOUNTER — Encounter: Payer: Self-pay | Admitting: Nurse Practitioner

## 2015-03-06 LAB — SURGICAL PATHOLOGY

## 2015-03-07 ENCOUNTER — Other Ambulatory Visit: Payer: Self-pay | Admitting: Nurse Practitioner

## 2015-03-07 ENCOUNTER — Telehealth: Payer: Self-pay | Admitting: Nurse Practitioner

## 2015-03-07 NOTE — Telephone Encounter (Signed)
Called pt to make sure radiology called and spoke with pt about results. She was pleased that it was benign and she denies further questions. She was pleased I called. Follow up as needed.

## 2015-04-08 ENCOUNTER — Encounter: Payer: Self-pay | Admitting: Nurse Practitioner

## 2015-04-08 ENCOUNTER — Ambulatory Visit: Payer: Self-pay | Admitting: Nurse Practitioner

## 2015-04-08 ENCOUNTER — Ambulatory Visit (INDEPENDENT_AMBULATORY_CARE_PROVIDER_SITE_OTHER): Payer: BLUE CROSS/BLUE SHIELD | Admitting: Nurse Practitioner

## 2015-04-08 VITALS — BP 100/68 | HR 79 | Temp 99.0°F | Resp 14 | Ht 68.0 in | Wt 146.0 lb

## 2015-04-08 DIAGNOSIS — F418 Other specified anxiety disorders: Secondary | ICD-10-CM | POA: Diagnosis not present

## 2015-04-08 DIAGNOSIS — G43809 Other migraine, not intractable, without status migrainosus: Secondary | ICD-10-CM | POA: Diagnosis not present

## 2015-04-08 DIAGNOSIS — Z23 Encounter for immunization: Secondary | ICD-10-CM | POA: Diagnosis not present

## 2015-04-08 MED ORDER — SUMATRIPTAN SUCCINATE 50 MG PO TABS: 50.0000 mg | ORAL_TABLET | Freq: Once | ORAL | Status: DC

## 2015-04-08 NOTE — Patient Instructions (Addendum)
See you in 6 months or sooner if you need me.   Thank you for getting your Tdap today.   Fasting labs at your next visit (nothing to eat or drink after midnight)

## 2015-04-08 NOTE — Assessment & Plan Note (Signed)
Patient was given updated T that today. She is around children and we discussed pertussis protection.

## 2015-04-08 NOTE — Progress Notes (Signed)
Patient ID: Whitney [name], female    DOB: 20-Jun-1970  Age: 44 y.o. MRN: [id number]  CC: Follow-up   HPI Whitney [name] presents for follow up of migraines.   1) Migraines- Doing okay. Around the cycle timeframe. 2 that week, using less Imitrex than prior. She feels fairly well recently.   2) Anxiety has improved since she was here last. She is doing well on 150 mg of Zoloft and intermittent xanax. Had to use only 1 xanax since last visit. Therapy has helped. She is seeing her therapist every 2 weeks and Dr. Maryruth Bun this Friday. She reports waking up and not feeling like she is afraid of having a panic attack daily.    History Whitney [name] has a past medical history of migraine headaches; Depression; Anxiety; Frequent headaches; Migraines; and Urinary tract infection.   She has past surgical history that includes Breast biopsy (Left, 03/04/2015).   Her family history includes Breast cancer in her mother; Cancer in her maternal aunt and mother; Diabetes in her father.She reports that she has never smoked. She has never used smokeless tobacco. She reports that she does not drink alcohol or use illicit drugs.  Outpatient Prescriptions Prior to Visit  Medication Sig Dispense Refill  . ALPRAZolam (XANAX) 0.25 MG tablet Take 0.25 mg by mouth 2 (two) times daily as needed for anxiety.    . Azelastine-Fluticasone (DYMISTA) 137-50 MCG/ACT SUSP Place 1 spray into the nose 2 (two) times daily.    . Ferrous Sulfate (IRON) 325 (65 FE) MG TABS Take 325 mg by mouth daily.    . sertraline (ZOLOFT) 100 MG tablet Take 100 mg by mouth daily.    . sertraline (ZOLOFT) 50 MG tablet Take 50 mg by mouth daily.    . SUMAtriptan (IMITREX) 50 MG tablet Take 1 tablet (50 mg total) by mouth once. May repeat in 2 hours if headache persists or recurs. 10 tablet 2   No facility-administered medications prior to visit.    ROS Review of Systems  Constitutional: Negative for fever, chills, diaphoresis and fatigue.   Respiratory: Negative for chest tightness, shortness of breath and wheezing.   Cardiovascular: Negative for chest pain, palpitations and leg swelling.  Gastrointestinal: Negative for nausea, vomiting and diarrhea.  Skin: Negative for rash.  Neurological: Positive for headaches. Negative for dizziness, weakness and numbness.       Improved with medication.  Psychiatric/Behavioral: Negative for suicidal ideas and sleep disturbance. The patient is nervous/anxious.        Improved with therapy.   Objective:  BP 100/68 mmHg  Pulse 79  Temp(Src) 99 F (37.2 C)  Resp 14  Ht  (1.727 m)  Wt 146 lb (66.225 kg)  BMI 22.20 kg/m2  SpO2 97%  Physical Exam  Constitutional: She is oriented to person, place, and time. She appears well-developed and well-nourished. No distress.  HENT:  Head: Normocephalic and atraumatic.  Right Ear: External ear normal.  Left Ear: External ear normal.  Cardiovascular: Normal rate, regular rhythm and normal heart sounds.  Exam reveals no gallop and no friction rub.   No murmur heard. Pulmonary/Chest: Effort normal and breath sounds normal. No respiratory distress. She has no wheezes. She has no rales. She exhibits no tenderness.  Neurological: She is alert and oriented to person, place, and time. No cranial nerve deficit. She exhibits normal muscle tone. Coordination normal.  Skin: Skin is warm and dry. No rash noted. She is not diaphoretic.  Psychiatric: She has a normal mood  and affect. Her behavior is normal. Judgment and thought content normal.  Patient seems less anxious today.   Assessment & Plan:   Whitney [name] was seen today for follow-up.  Diagnoses and all orders for this visit:  Other migraine without status migrainosus, not intractable  Depression with anxiety  Need for Tdap vaccination  Other orders -     SUMAtriptan (IMITREX) 50 MG tablet; Take 1 tablet (50 mg total) by mouth once. May repeat in 2 hours if headache persists or  recurs.  I am having Ms. Shipley maintain her Iron, Azelastine-Fluticasone, sertraline, ALPRAZolam, sertraline, and SUMAtriptan.  Meds ordered this encounter  Medications  . SUMAtriptan (IMITREX) 50 MG tablet    Sig: Take 1 tablet (50 mg total) by mouth once. May repeat in 2 hours if headache persists or recurs.    Dispense:  10 tablet    Refill:  2    Order Specific Question:  Supervising Provider    Answer:  Sherlene ShamsULLO, TERESA L [[id number]]     Follow-up: Return in about 6 months (around 10/06/2015) for CPE w/ fasting labs.

## 2015-04-08 NOTE — Assessment & Plan Note (Signed)
Patient is improving. Still having migraines around cycles but is controlled with Imitrex. She uses less than one bottle a month, which is an improvement from previous. We'll follow-up in 6 months.

## 2015-04-08 NOTE — Addendum Note (Signed)
Addended by: Talmage NapSTEELMAN, Ronnae Kaser A on: 04/08/2015 08:52 AM   Modules accepted: Orders

## 2015-04-08 NOTE — Assessment & Plan Note (Signed)
Depression and anxiety is improved with medications. She is only taking approximately 1 Xanax every few weeks. She is also seeing a therapist and her psychiatrist Dr. Maryruth BunKapur. She is feeling good and stable at this time. Follow-up in 6 months for physical and fasting labs.

## 2015-04-08 NOTE — Progress Notes (Signed)
Pre visit review using our clinic review tool, if applicable. No additional management support is needed unless otherwise documented below in the visit note. 

## 2015-05-15 NOTE — Addendum Note (Signed)
Encounter addended by: Kim R Dodson on: 05/15/2015  1:39 PM<BR>     Documentation filed: Charges VN

## 2015-06-16 ENCOUNTER — Telehealth: Payer: Self-pay | Admitting: Nurse Practitioner

## 2015-06-16 NOTE — Telephone Encounter (Signed)
Pt would like to get labs done before her appt. Need orders please and thank you!

## 2015-06-16 NOTE — Telephone Encounter (Signed)
Goodmornig Whitney [name], Can you put in lab orders for pt to get her bloodwork done she wants to get it done before her appt with you, I will call and schedule her appt for labs. Please advise

## 2015-06-17 ENCOUNTER — Encounter: Payer: Self-pay | Admitting: Family Medicine

## 2015-06-17 ENCOUNTER — Other Ambulatory Visit: Payer: Self-pay | Admitting: Nurse Practitioner

## 2015-06-17 ENCOUNTER — Ambulatory Visit (INDEPENDENT_AMBULATORY_CARE_PROVIDER_SITE_OTHER): Payer: BLUE CROSS/BLUE SHIELD | Admitting: Family Medicine

## 2015-06-17 VITALS — BP 142/84 | HR 84 | Temp 98.6°F | Ht 68.0 in | Wt 148.2 lb

## 2015-06-17 DIAGNOSIS — F411 Generalized anxiety disorder: Secondary | ICD-10-CM

## 2015-06-17 DIAGNOSIS — J069 Acute upper respiratory infection, unspecified: Secondary | ICD-10-CM | POA: Diagnosis not present

## 2015-06-17 DIAGNOSIS — G43809 Other migraine, not intractable, without status migrainosus: Secondary | ICD-10-CM

## 2015-06-17 DIAGNOSIS — Z13220 Encounter for screening for lipoid disorders: Secondary | ICD-10-CM

## 2015-06-17 NOTE — Telephone Encounter (Signed)
Scheduled pt appointment and notified pt she needs to come in fasting

## 2015-06-17 NOTE — Patient Instructions (Signed)
This is viral.  Use nasal spray/saline and flonase.  Call if you worsen or fail to improve.  Take care  Dr. Adriana Simas

## 2015-06-17 NOTE — Progress Notes (Signed)
   Subjective:  Patient ID: Whitney [name], female    DOB: May 14, 1970  Age: 45 y.o. MRN: [id number]  CC: ? Sinus infection  HPI:  45 year old female presents to the clinic today with concerns that she may have a sinus infection.  Patient states she's not felt well for the past 4 days. She been expressing head congestion, sore throat, nasal drainage, productive cough. Cough is productive and discolored. She reports that she's had a fever of 100.3 recently. No associated chills. No known exacerbating or relieving factors. She has had known sick contacts.  Social Hx   Social History   Social History  . Marital Status: Married    Spouse Name: N/A  . Number of Children: N/A  . Years of Education: N/A   Social History Main Topics  . Smoking status: Never Smoker   . Smokeless tobacco: Never Used  . Alcohol Use: No  . Drug Use: No  . Sexual Activity:    Partners: Male   Other Topics Concern  . None   Social History Narrative   Works at [employer] as a Psychologist, forensic   Lives with her husband and 2 children   Caffeine- 2 cups of tea   Review of Systems  Constitutional: Positive for fever. Negative for chills.  HENT: Positive for congestion and sore throat.   Respiratory: Positive for cough.     Objective:  BP 142/84 mmHg  Pulse 84  Temp(Src) 98.6 F (37 C) (Oral)  Ht  (1.727 m)  Wt 148 lb 4 oz (67.246 kg)  BMI 22.55 kg/m2  SpO2 98%  BP/Weight 06/17/2015 04/08/2015 02/05/2015  Systolic BP 142 100 98  Diastolic BP 84 68 76  Wt. (Lbs) 148.25 146 144.4  BMI 22.55 22.2 21.96   Physical Exam  Constitutional: She is oriented to person, place, and time. She appears well-developed. No distress.  HENT:  Head: Normocephalic and atraumatic.  Mouth/Throat: Oropharynx is clear and moist.  Normal TM's bilaterally.   Neck: Neck supple.  Cardiovascular: Normal rate and regular rhythm.   No murmur heard. Pulmonary/Chest: Effort normal and breath sounds normal. No  respiratory distress. She has no wheezes. She has no rales.  Lymphadenopathy:    She has no cervical adenopathy.  Neurological: She is alert and oriented to person, place, and time.  Vitals reviewed.  Lab Results  Component Value Date   WBC 5.2 12/08/2014   HGB 14.0 12/08/2014   HCT 42.7 12/08/2014   PLT 207 12/08/2014   GLUCOSE 108* 12/08/2014   ALT 10* 12/08/2014   AST 13* 12/08/2014   NA 138 12/08/2014   K 3.8 12/08/2014   CL 107 12/08/2014   CREATININE 0.65 12/08/2014   BUN 16 12/08/2014   CO2 25 12/08/2014    Assessment & Plan:   Problem List Items Addressed This Visit    URI (upper respiratory infection) - Primary    New problem. No evidence of bacterial infection. Advised PRN Sudafed and Flonase/nettipot.        Follow-up: PRN  Everlene Other DO [facility]

## 2015-06-17 NOTE — Assessment & Plan Note (Signed)
New problem. No evidence of bacterial infection. Advised PRN Sudafed and Flonase/nettipot.

## 2015-06-17 NOTE — Telephone Encounter (Signed)
Orders placed; needs to be fasting

## 2015-06-17 NOTE — Progress Notes (Signed)
Pre visit review using our clinic review tool, if applicable. No additional management support is needed unless otherwise documented below in the visit note. 

## 2015-06-19 ENCOUNTER — Other Ambulatory Visit (INDEPENDENT_AMBULATORY_CARE_PROVIDER_SITE_OTHER): Payer: BLUE CROSS/BLUE SHIELD

## 2015-06-19 DIAGNOSIS — G43809 Other migraine, not intractable, without status migrainosus: Secondary | ICD-10-CM

## 2015-06-19 DIAGNOSIS — Z13220 Encounter for screening for lipoid disorders: Secondary | ICD-10-CM | POA: Diagnosis not present

## 2015-06-19 DIAGNOSIS — F411 Generalized anxiety disorder: Secondary | ICD-10-CM

## 2015-06-19 LAB — CBC WITH DIFFERENTIAL/PLATELET
BASOS ABS: 0 10*3/uL (ref 0.0–0.1)
BASOS PCT: 0.5 % (ref 0.0–3.0)
EOS ABS: 0 10*3/uL (ref 0.0–0.7)
Eosinophils Relative: 0.6 % (ref 0.0–5.0)
HEMATOCRIT: 39.8 % (ref 36.0–46.0)
HEMOGLOBIN: 13.2 g/dL (ref 12.0–15.0)
LYMPHS PCT: 35.7 % (ref 12.0–46.0)
Lymphs Abs: 1.7 10*3/uL (ref 0.7–4.0)
MCHC: 33.2 g/dL (ref 30.0–36.0)
MCV: 89.4 fl (ref 78.0–100.0)
Monocytes Absolute: 0.3 10*3/uL (ref 0.1–1.0)
Monocytes Relative: 7.4 % (ref 3.0–12.0)
Neutro Abs: 2.6 10*3/uL (ref 1.4–7.7)
Neutrophils Relative %: 55.8 % (ref 43.0–77.0)
Platelets: 221 10*3/uL (ref 150.0–400.0)
RBC: 4.46 Mil/uL (ref 3.87–5.11)
RDW: 12.8 % (ref 11.5–15.5)
WBC: 4.7 10*3/uL (ref 4.0–10.5)

## 2015-06-19 LAB — LIPID PANEL
CHOLESTEROL: 207 mg/dL — AB (ref 0–200)
HDL: 47.5 mg/dL (ref >39.00)
LDL CALC: 139 mg/dL — AB (ref 0–99)
NonHDL: 159.23
Total CHOL/HDL Ratio: 4
Triglycerides: 103 mg/dL (ref 0.0–149.0)
VLDL: 20.6 mg/dL (ref 0.0–40.0)

## 2015-06-19 LAB — COMPREHENSIVE METABOLIC PANEL
ALBUMIN: 4.3 g/dL (ref 3.5–5.2)
ALT: 10 U/L (ref 0–35)
AST: 11 U/L (ref 0–37)
Alkaline Phosphatase: 40 U/L (ref 39–117)
BILIRUBIN TOTAL: 0.5 mg/dL (ref 0.2–1.2)
BUN: 13 mg/dL (ref 6–23)
CALCIUM: 9.2 mg/dL (ref 8.4–10.5)
CO2: 29 mEq/L (ref 19–32)
CREATININE: 0.67 mg/dL (ref 0.40–1.20)
Chloride: 105 mEq/L (ref 96–112)
GFR: 101.79 mL/min (ref >60.00)
Glucose, Bld: 108 mg/dL — ABNORMAL HIGH (ref 70–99)
Potassium: 4.1 mEq/L (ref 3.5–5.1)
Sodium: 139 mEq/L (ref 135–145)
Total Protein: 6.7 g/dL (ref 6.0–8.3)

## 2015-06-19 LAB — TSH: TSH: 1.43 u[IU]/mL (ref 0.35–4.50)

## 2015-07-14 ENCOUNTER — Ambulatory Visit (INDEPENDENT_AMBULATORY_CARE_PROVIDER_SITE_OTHER): Payer: BLUE CROSS/BLUE SHIELD | Admitting: Nurse Practitioner

## 2015-07-14 ENCOUNTER — Encounter: Payer: Self-pay | Admitting: Nurse Practitioner

## 2015-07-14 VITALS — BP 94/62 | HR 94 | Temp 98.2°F | Resp 16 | Ht 68.0 in | Wt 149.4 lb

## 2015-07-14 DIAGNOSIS — J069 Acute upper respiratory infection, unspecified: Secondary | ICD-10-CM

## 2015-07-14 DIAGNOSIS — K5900 Constipation, unspecified: Secondary | ICD-10-CM | POA: Insufficient documentation

## 2015-07-14 DIAGNOSIS — F418 Other specified anxiety disorders: Secondary | ICD-10-CM

## 2015-07-14 DIAGNOSIS — K5901 Slow transit constipation: Secondary | ICD-10-CM | POA: Diagnosis not present

## 2015-07-14 NOTE — Patient Instructions (Signed)
If you get worse- take Mucinex D or DM for your symptoms, tylenol or ibuprofen, nyquil, and or dayquil.   Adenovirus Adenoviruses are common viruses that cause many different types of infections. The common cold (upper respiratory infection) is the most common type of infection from an adenovirus. Adenoviruses can also infect your digestive system, eyes, lungs, and bladder. An adenovirus spreads easily from person to person. This is especially true if you are in close contact with someone who is sick. You can breathe in the virus after a sick person coughs or sneezes. Adenoviruses can live outside the body for many weeks. Therefore, you can also get sick after touching an object the virus is living on and then touching your nose or mouth. Adenovirus infections are usually not serious unless you have another health problem that makes it hard for you to fight off the infection.  CAUSES  There are more than 50 types of adenoviruses that can cause infections in humans. Different types of adenoviruses cause different types of infection.  RISK FACTORS The risk for an adenovirus infection is higher for:  People who spend a lot of time in places where there are lots of other people. This includes schools, summer camps, and day care centers.  Babies.  Elderly people.  People with a weak body defense system (immune system).  People with heart or lung disease. SIGNS AND SYMPTOMS  It can take 2-14 days to develop symptoms after the virus gets into your body. Symptoms may include:  Fever.  Sore throat.  Ear pain or fullness.  Nasal congestion.  Cough.  Difficulty breathing.  Stomachache.  Diarrhea.  Pain while passing urine.  Blood in the urine.  Pinkeye (conjunctivitis). DIAGNOSIS  Your health care provider may diagnose an adenovirus infection from your signs and symptoms. A physical exam will also be done. You may have tests to make sure your symptoms are not caused by another type  of problem. These can include a blood test, throat culture, or chest X-ray. TREATMENT  There is no treatment for an adenovirus infection. These infections usually clear up on their own with home care. Your health care provider may recommend over-the-counter medicine to help relieve a sore throat, fever, or headache. HOME CARE INSTRUCTIONS  Rest at home until your symptoms go away.  Drink enough fluid to keep your urine clear or pale yellow.  Take medicines only as directed by your health care provider. PREVENTION   Wash your hands often with soap and water.  Avoid close contact with people who are sick.  Do not go to school or work when you are sick.  Cover your nose or mouth when you sneeze or cough. SEEK MEDICAL CARE IF: Your symptoms of adenovirus infection do not clear up or are getting worse after several days. SEEK IMMEDIATE MEDICAL CARE IF: You have trouble breathing. MAKE SURE YOU:  Understand these instructions.  Will watch your condition.  Will get help right away if you are not doing well or get worse.   This information is not intended to replace advice given to you by your health care provider. Make sure you discuss any questions you have with your health care provider.   Document Released: 07/17/2002 Document Revised: 05/17/2014 Document Reviewed: 08/29/2013 Elsevier Interactive Patient Education [employer]2016 Elsevier Inc.

## 2015-07-14 NOTE — Assessment & Plan Note (Signed)
Stable. Pt reports lifelong BMs every 3-4 days. Has used a laxative in the past. Metamucil and water intake. Pt is on iron, which could be a factor. Activia recommended. Increase activity level. FU prn

## 2015-07-14 NOTE — Assessment & Plan Note (Signed)
Beginning to have scratchy throat symptoms.  Pt does appear well today  Advised OTC measures if worsens

## 2015-07-14 NOTE — Progress Notes (Signed)
Patient ID: Whitney [name], female    DOB: 04-15-1971  Age: 45 y.o. MRN: [id number]  CC: Follow-up   HPI Whitney [name] presents for follow up of anxiety.   1) Labs at patient request were normal.  Walking 30 minutes x 4 a week  Prior to period feels her anxiety is worsening   Dr. Maryruth BunKapur- upped xanax and on Zoloft  LMP- started today  2) Pt reports having a BM every 3-4 days, but is concerned that this is constipation. She reports this [name] always been this way and denies abdominal pain/bloating.   3) Scratchy throat today, pt [name] been exposed to viruses recently, but feels well otherwise.  History Whitney [name] [name] a past medical history of migraine headaches; Depression; Anxiety; Frequent headaches; Migraines; and Urinary tract infection.   She [name] past surgical history that includes Breast biopsy (Left, 03/04/2015).   Her family history includes Breast cancer in her mother; Cancer in her maternal aunt and mother; Diabetes in her father.She reports that she [name] never smoked. She [name] never used smokeless tobacco. She reports that she does not drink alcohol or use illicit drugs.  Outpatient Prescriptions Prior to Visit  Medication Sig Dispense Refill  . Ferrous Sulfate (IRON) 325 (65 FE) MG TABS Take 325 mg by mouth daily.    . SUMAtriptan (IMITREX) 50 MG tablet Take 1 tablet (50 mg total) by mouth once. May repeat in 2 hours if headache persists or recurs. 10 tablet 2  . ALPRAZolam (XANAX) 0.25 MG tablet Take 0.25 mg by mouth 2 (two) times daily as needed for anxiety.    . Azelastine-Fluticasone (DYMISTA) 137-50 MCG/ACT SUSP Place 1 spray into the nose 2 (two) times daily. Reported on 07/14/2015    . sertraline (ZOLOFT) 100 MG tablet Take 100 mg by mouth daily.    . sertraline (ZOLOFT) 50 MG tablet Take 50 mg by mouth daily.     No facility-administered medications prior to visit.    ROS Review of Systems  Constitutional: Negative for fever, chills, diaphoresis and fatigue.   Respiratory: Negative for chest tightness, shortness of breath and wheezing.   Cardiovascular: Negative for chest pain, palpitations and leg swelling.  Gastrointestinal: Positive for constipation. Negative for nausea, vomiting, abdominal pain, diarrhea, blood in stool and abdominal distention.  Skin: Negative for rash.  Neurological: Negative for dizziness, weakness, numbness and headaches.  Psychiatric/Behavioral: The patient is nervous/anxious.     Objective:  BP 94/62 mmHg  Pulse 94  Temp(Src) 98.2 F (36.8 C) (Oral)  Resp 16  Ht 5\' 8"  (1.727 m)  Wt 149 lb 6.4 oz (67.767 kg)  BMI 22.72 kg/m2  SpO2 97%  LMP 07/14/2015  Physical Exam  Constitutional: She is oriented to person, place, and time. She appears well-developed and well-nourished. No distress.  HENT:  Head: Normocephalic and atraumatic.  Right Ear: External ear normal.  Left Ear: External ear normal.  Mouth/Throat: No oropharyngeal exudate.  TM's clear bilaterally  Eyes: EOM are normal. Pupils are equal, round, and reactive to light. Right eye exhibits no discharge. Left eye exhibits no discharge. No scleral icterus.  Neck: Normal range of motion. Neck supple.  Cardiovascular: Normal rate, regular rhythm and normal heart sounds.  Exam reveals no gallop and no friction rub.   No murmur heard. Pulmonary/Chest: Effort normal and breath sounds normal. No respiratory distress. She [name] no wheezes. She [name] no rales. She exhibits no tenderness.  Abdominal: Soft. Bowel sounds are normal. She exhibits no distension and no  mass. There is no tenderness. There is no rebound and no guarding.  Lymphadenopathy:    She [name] no cervical adenopathy.  Neurological: She is alert and oriented to person, place, and time. No cranial nerve deficit. She exhibits normal muscle tone. Coordination normal.  Skin: Skin is warm and dry. No rash noted. She is not diaphoretic.  Psychiatric: She [name] a normal mood and affect. Her behavior is normal.  Judgment and thought content normal.   Assessment & Plan:   There are no diagnoses linked to this encounter. I have discontinued [name]'s Azelastine-Fluticasone. I am also having her maintain her Iron, SUMAtriptan, ALPRAZolam, and sertraline.  Meds ordered this encounter  Medications  . ALPRAZolam (XANAX) 0.5 MG tablet    Sig:   . sertraline (ZOLOFT) 100 MG tablet    Sig: Take 200 mg by mouth daily.     Follow-up: No Follow-up on file.

## 2015-07-14 NOTE — Assessment & Plan Note (Signed)
Stable. Seeing Dr. Maryruth BunKapur to titrate medications. Pt reports related to periods. Pt did not make a physical appointment- will do this another visit.

## 2015-07-17 ENCOUNTER — Encounter: Payer: Self-pay | Admitting: Nurse Practitioner

## 2015-08-11 ENCOUNTER — Encounter: Payer: Self-pay | Admitting: Nurse Practitioner

## 2015-08-26 DIAGNOSIS — F411 Generalized anxiety disorder: Secondary | ICD-10-CM | POA: Diagnosis not present

## 2015-08-28 ENCOUNTER — Encounter: Payer: Self-pay | Admitting: Nurse Practitioner

## 2015-08-28 ENCOUNTER — Ambulatory Visit (INDEPENDENT_AMBULATORY_CARE_PROVIDER_SITE_OTHER): Payer: BLUE CROSS/BLUE SHIELD | Admitting: Nurse Practitioner

## 2015-08-28 ENCOUNTER — Other Ambulatory Visit (INDEPENDENT_AMBULATORY_CARE_PROVIDER_SITE_OTHER): Payer: BLUE CROSS/BLUE SHIELD

## 2015-08-28 VITALS — BP 94/65 | HR 99 | Temp 98.6°F | Ht 68.0 in | Wt 146.1 lb

## 2015-08-28 DIAGNOSIS — G43809 Other migraine, not intractable, without status migrainosus: Secondary | ICD-10-CM

## 2015-08-28 DIAGNOSIS — N920 Excessive and frequent menstruation with regular cycle: Secondary | ICD-10-CM

## 2015-08-28 DIAGNOSIS — F418 Other specified anxiety disorders: Secondary | ICD-10-CM | POA: Diagnosis not present

## 2015-08-28 LAB — CBC WITH DIFFERENTIAL/PLATELET
Basophils Absolute: 0 10*3/uL (ref 0.0–0.1)
Basophils Relative: 0.6 % (ref 0.0–3.0)
EOS ABS: 0 10*3/uL (ref 0.0–0.7)
Eosinophils Relative: 0.6 % (ref 0.0–5.0)
HEMATOCRIT: 33.7 % — AB (ref 36.0–46.0)
Hemoglobin: 11.3 g/dL — ABNORMAL LOW (ref 12.0–15.0)
LYMPHS PCT: 34.3 % (ref 12.0–46.0)
Lymphs Abs: 2 10*3/uL (ref 0.7–4.0)
MCHC: 33.6 g/dL (ref 30.0–36.0)
MCV: 88.6 fl (ref 78.0–100.0)
Monocytes Absolute: 0.6 10*3/uL (ref 0.1–1.0)
Monocytes Relative: 9.5 % (ref 3.0–12.0)
NEUTROS ABS: 3.3 10*3/uL (ref 1.4–7.7)
Neutrophils Relative %: 55 % (ref 43.0–77.0)
PLATELETS: 233 10*3/uL (ref 150.0–400.0)
RBC: 3.81 Mil/uL — ABNORMAL LOW (ref 3.87–5.11)
RDW: 14.5 % (ref 11.5–15.5)
WBC: 5.9 10*3/uL (ref 4.0–10.5)

## 2015-08-28 LAB — IBC PANEL
Iron: 33 ug/dL — ABNORMAL LOW (ref 42–145)
Saturation Ratios: 9 % — ABNORMAL LOW (ref 20.0–50.0)
Transferrin: 263 mg/dL (ref 212.0–360.0)

## 2015-08-28 LAB — FERRITIN: Ferritin: 21.3 ng/mL (ref 10.0–291.0)

## 2015-08-28 LAB — HEMOGLOBIN A1C: HEMOGLOBIN A1C: 5.4 % (ref 4.6–6.5)

## 2015-08-28 MED ORDER — SUMATRIPTAN SUCCINATE 50 MG PO TABS: 50.0000 mg | ORAL_TABLET | Freq: Once | ORAL | Status: DC

## 2015-08-28 NOTE — Progress Notes (Signed)
Pre visit review using our clinic review tool, if applicable. No additional management support is needed unless otherwise documented below in the visit note. 

## 2015-08-28 NOTE — Progress Notes (Signed)
Patient ID: Whitney [name], female    DOB: [birth year]  Age: 45 y.o. MRN: [id number]  CC: Menorrhagia   HPI Whitney [name] presents for CC heavy menstrual cycles.   1) Gotten worse over the past few years  Past year has been the worst  Starts heavy, started a few days early this time   Lasted 10 days, heavy, larger clots (for a few days nickle sized). Pads and tampons and went through every 1-2 hrs for the first 2 days and last 3 days of the cycle this time.   2) Increased panic recently due to the bleeding concerns Seeing up close is bothersome and hasn't had a recent eye exam (she feels this is her age)    History Whitney [name] has a past medical history of migraine headaches; Depression; Anxiety; Frequent headaches; Migraines; and Urinary tract infection.   She has past surgical history that includes Breast biopsy (Left, 03/04/2015).   Her family history includes Breast cancer in her mother; Cancer in her maternal aunt and mother; Diabetes in her father.She reports that she has never smoked. She has never used smokeless tobacco. She reports that she does not drink alcohol or use illicit drugs.  Outpatient Prescriptions Prior to Visit  Medication Sig Dispense Refill  . ALPRAZolam (XANAX) 0.5 MG tablet     . Ferrous Sulfate (IRON) 325 (65 FE) MG TABS Take 325 mg by mouth daily.    . sertraline (ZOLOFT) 100 MG tablet Take 200 mg by mouth daily.    . SUMAtriptan (IMITREX) 50 MG tablet Take 1 tablet (50 mg total) by mouth once. May repeat in 2 hours if headache persists or recurs. 10 tablet 2   No facility-administered medications prior to visit.    ROS Review of Systems  Constitutional: Negative for fever, chills, diaphoresis and fatigue.  Eyes: Positive for visual disturbance.  Respiratory: Negative for chest tightness, shortness of breath and wheezing.   Cardiovascular: Negative for chest pain, palpitations and leg swelling.  Gastrointestinal: Negative for nausea, vomiting  and diarrhea.  Genitourinary: Positive for vaginal bleeding and menstrual problem.  Skin: Negative for rash.  Neurological: Negative for dizziness, weakness, numbness and headaches.  Psychiatric/Behavioral: Positive for sleep disturbance. The patient is nervous/anxious.     Objective:  BP 94/65 mmHg  Pulse 99  Temp(Src) 98.6 F (37 C) (Oral)  Ht  (1.727 m)  Wt 146 lb 2 oz (66.282 kg)  BMI 22.22 kg/m2  SpO2 99%  LMP 08/14/2015  Physical Exam  Constitutional: She is oriented to person, place, and time. She appears well-developed and well-nourished. No distress.  HENT:  Head: Normocephalic and atraumatic.  Right Ear: External ear normal.  Left Ear: External ear normal.  Eyes: EOM are normal. Pupils are equal, round, and reactive to light. Right eye exhibits no discharge. Left eye exhibits no discharge. No scleral icterus.  Cardiovascular: Normal rate, regular rhythm and normal heart sounds.   Pulmonary/Chest: Effort normal and breath sounds normal. No respiratory distress. She has no wheezes. She has no rales. She exhibits no tenderness.  Abdominal: Soft. Bowel sounds are normal. She exhibits no distension and no mass. There is no tenderness. There is no rebound and no guarding.  Genitourinary:  Deferred at this time  Neurological: She is alert and oriented to person, place, and time. Coordination normal.  Skin: Skin is warm and dry. No rash noted. She is not diaphoretic.  Psychiatric: She has a normal mood and affect. Her behavior is normal. Judgment and  thought content normal.  Very anxious today   Assessment & Plan:   Whitney [name] was seen today for menorrhagia.  Diagnoses and all orders for this visit:  Menorrhagia with regular cycle -     CBC with Differential/Platelet -     Cancel: Iron Binding Cap (TIBC) -     Ferritin -     HgB A1c  Depression with anxiety  Other migraine without status migrainosus, not intractable  Other orders -     SUMAtriptan (IMITREX) 50  MG tablet; Take 1 tablet (50 mg total) by mouth once. May repeat in 2 hours if headache persists or recurs.   I am having Whitney [name] maintain her Iron, ALPRAZolam, sertraline, and SUMAtriptan.  Meds ordered this encounter  Medications  . SUMAtriptan (IMITREX) 50 MG tablet    Sig: Take 1 tablet (50 mg total) by mouth once. May repeat in 2 hours if headache persists or recurs.    Dispense:  10 tablet    Refill:  2     Follow-up: Return if symptoms worsen or fail to improve.

## 2015-08-28 NOTE — Patient Instructions (Addendum)
We will call to schedule your ultrasound.   We will send results via MyChart.

## 2015-09-04 ENCOUNTER — Other Ambulatory Visit: Payer: Self-pay | Admitting: Nurse Practitioner

## 2015-09-04 ENCOUNTER — Encounter: Payer: Self-pay | Admitting: Nurse Practitioner

## 2015-09-04 DIAGNOSIS — N920 Excessive and frequent menstruation with regular cycle: Secondary | ICD-10-CM

## 2015-09-07 DIAGNOSIS — N920 Excessive and frequent menstruation with regular cycle: Secondary | ICD-10-CM | POA: Insufficient documentation

## 2015-09-07 NOTE — Assessment & Plan Note (Signed)
Pt needs refill on Imitrex

## 2015-09-07 NOTE — Assessment & Plan Note (Signed)
Worsening currently Seeing Dr. Maryruth Bunkapur

## 2015-09-07 NOTE — Assessment & Plan Note (Addendum)
Checking labs today Pelvic US/Transvaginal US on hold until results  Could be related to early menopause Pt taking iron supplements FU prn worsening/failure to improve.

## 2015-09-08 ENCOUNTER — Ambulatory Visit
Admission: RE | Admit: 2015-09-08 | Discharge: 2015-09-08 | Disposition: A | Discharge status: Home / Self Care | Payer: BLUE CROSS/BLUE SHIELD | Source: Ambulatory Visit | Attending: Nurse Practitioner | Admitting: Nurse Practitioner

## 2015-09-08 DIAGNOSIS — N8311 Corpus luteum cyst of right ovary: Secondary | ICD-10-CM | POA: Diagnosis not present

## 2015-09-08 DIAGNOSIS — N920 Excessive and frequent menstruation with regular cycle: Secondary | ICD-10-CM | POA: Diagnosis not present

## 2015-09-08 DIAGNOSIS — N83201 Unspecified ovarian cyst, right side: Secondary | ICD-10-CM | POA: Diagnosis not present

## 2015-09-09 ENCOUNTER — Other Ambulatory Visit: Payer: Self-pay | Admitting: Nurse Practitioner

## 2015-09-09 ENCOUNTER — Encounter: Payer: Self-pay | Admitting: Nurse Practitioner

## 2015-09-09 DIAGNOSIS — N939 Abnormal uterine and vaginal bleeding, unspecified: Secondary | ICD-10-CM

## 2015-09-10 ENCOUNTER — Encounter: Payer: Self-pay | Admitting: Nurse Practitioner

## 2015-10-15 ENCOUNTER — Ambulatory Visit (INDEPENDENT_AMBULATORY_CARE_PROVIDER_SITE_OTHER): Payer: BLUE CROSS/BLUE SHIELD | Admitting: Primary Care

## 2015-10-15 ENCOUNTER — Encounter: Payer: Self-pay | Admitting: Primary Care

## 2015-10-15 VITALS — BP 102/64 | HR 82 | Temp 98.5°F | Ht 68.0 in | Wt 147.4 lb

## 2015-10-15 DIAGNOSIS — D509 Iron deficiency anemia, unspecified: Secondary | ICD-10-CM

## 2015-10-15 DIAGNOSIS — N920 Excessive and frequent menstruation with regular cycle: Secondary | ICD-10-CM | POA: Diagnosis not present

## 2015-10-15 DIAGNOSIS — R519 Headache, unspecified: Secondary | ICD-10-CM

## 2015-10-15 DIAGNOSIS — R51 Headache: Secondary | ICD-10-CM | POA: Diagnosis not present

## 2015-10-15 DIAGNOSIS — F418 Other specified anxiety disorders: Secondary | ICD-10-CM

## 2015-10-15 DIAGNOSIS — G43809 Other migraine, not intractable, without status migrainosus: Secondary | ICD-10-CM | POA: Diagnosis not present

## 2015-10-15 MED ORDER — TOPIRAMATE 25 MG PO TABS: 25.0000 mg | ORAL_TABLET | Freq: Every day | ORAL | Status: DC

## 2015-10-15 NOTE — Assessment & Plan Note (Addendum)
Long-standing history of, managed on Imitrex as needed. Takes BC powder regularly for frequent headaches. Has never been managed on preventative medication for her headaches.  Given frequency of headaches and regular use of BC powder will initiate her on low dose Topamax. She is to email me or call me in one month with an update on headaches. All possible side effects of this medication were reviewed with patient in person.  In the meantime she will also see her GYN as her headaches are also likely attributed to her menstrual cycles.

## 2015-10-15 NOTE — Assessment & Plan Note (Signed)
Currently managed on Zoloft 100 mg and alprazolam 0.5 mg per psychiatry. She is due for follow-up with Dr. Maryruth BunKapur in several weeks.

## 2015-10-15 NOTE — Assessment & Plan Note (Addendum)
Evidence on labs from April 2017, likely related to heavy menstrual periods. Will reevaluate at upcoming physical in early August. Continue iron 325 mg daily.

## 2015-10-15 NOTE — Progress Notes (Signed)
Subjective:    Patient ID: Whitney [name], female    DOB: [birth year], 45 y.o.   MRN: [id number]  HPI  Whitney [name] is a 45 year old female who presents today to transfer care from [facility].   1) Migraines: Present for years, also with frequent headaches. She will experience migraines around her menstrual cycle. Currently managed on Imitrex 50 mg tablets. She has used her Imitrex regularly recently. She will try to manage with BC powder, but is also trying to use this infrequently due to long term effects of BC powder use.   She experiences headaches twice weekly on average. She will experience pain to her right temporal region usually. She does get nausea. She was once managed by a headache specialist who recommend botox injections, but could not afford so she never returned.   2) Anxiety and Depression: Currently managed on Zoloft 100 mg and Alprazolam 0.5 mg. She currently follows with psychiatry, Dr. Maryruth Bun. She endorses panic attacks that occur during menstrual cycles. She uses her alprazolam sparingly around her menstrual cycles. She is due this month for re-evaluation. Once seeing therapy, but has now completed her sessions.   3) Iron Deficiency Anemia: Iron panel from April 2017 with low iron. She is currently managed on ferrous sulfate 325 mg daily. She does experience heavy periods and will be establishing with a new GYN later this month for re-evaluation of heavy periods and hormone imbalance.   Review of Systems  Constitutional: Negative for fatigue and unexpected weight change.  Respiratory: Negative for shortness of breath.   Cardiovascular: Negative for chest pain.  Genitourinary: Positive for menstrual problem.  Neurological: Positive for headaches. Negative for weakness.  Psychiatric/Behavioral: Negative for suicidal ideas. The patient is not nervous/anxious.        Past Medical History  Diagnosis Date  . Hx of migraine headaches   . Depression   . Anxiety    . Frequent headaches   . Migraines   . Urinary tract infection      Social History   Social History  . Marital Status: Married    Spouse Name: N/A  . Number of Children: N/A  . Years of Education: N/A   Occupational History  . Not on file.   Social History Main Topics  . Smoking status: Never Smoker   . Smokeless tobacco: Never Used  . Alcohol Use: No  . Drug Use: No  . Sexual Activity:    Partners: Male   Other Topics Concern  . Not on file   Social History Narrative   Works at [employer] as a Psychologist, forensic   Lives with her husband and 2 children.   Caffeine- 2 cups of tea.   Enjoys spending time with family.    Past Surgical History  Procedure Laterality Date  . Breast biopsy Left 03/04/2015    path pending    Family History  Problem Relation Age of Onset  . Cancer Mother     Breast Cancer  . Breast cancer Mother   . Diabetes Father   . Cancer Maternal Aunt     Lung Cancer    No Known Allergies  Current Outpatient Prescriptions on File Prior to Visit  Medication Sig Dispense Refill  . ALPRAZolam (XANAX) 0.5 MG tablet Take 0.5 mg by mouth as needed.     . Ferrous Sulfate (IRON) 325 (65 FE) MG TABS Take 325 mg by mouth daily.    . sertraline (ZOLOFT) 100 MG tablet  Take 200 mg by mouth daily.    . SUMAtriptan (IMITREX) 50 MG tablet Take 1 tablet (50 mg total) by mouth once. May repeat in 2 hours if headache persists or recurs. 10 tablet 2   No current facility-administered medications on file prior to visit.    BP 102/64 mmHg  Pulse 82  Temp(Src) 98.5 F (36.9 C) (Oral)  Ht 5\' 8"  (1.727 m)  Wt 147 lb 6.4 oz (66.86 kg)  BMI 22.42 kg/m2  SpO2 99%  LMP 10/07/2015    Objective:   Physical Exam  Constitutional: She appears well-nourished.  Neck: Neck supple.  Cardiovascular: Normal rate and regular rhythm.   Pulmonary/Chest: Effort normal and breath sounds normal.  Skin: Skin is warm and dry.  Psychiatric: She has a normal mood and  affect.          Assessment & Plan:

## 2015-10-15 NOTE — Assessment & Plan Note (Signed)
She is establishing with a new GYN in a few weeks

## 2015-10-15 NOTE — Patient Instructions (Signed)
Start Topamax tablets for frequent headaches. Take 1 tablet by mouth every night at bedtime.   Please e-mail me in 1 month with an update. We are starting you out on a low dose, so there is room to increase the dose if no improvement.   Please schedule a physical with me this summer at your convenience. You may also schedule a lab only appointment 3-4 days prior. We will discuss your lab results in detail during your physical.  It was a pleasure to meet you today! Please don't hesitate to call me with any questions. [facility]

## 2015-10-16 ENCOUNTER — Encounter: Payer: BLUE CROSS/BLUE SHIELD | Admitting: Nurse Practitioner

## 2015-10-23 ENCOUNTER — Encounter: Payer: Self-pay | Admitting: Obstetrics and Gynecology

## 2015-10-29 DIAGNOSIS — F411 Generalized anxiety disorder: Secondary | ICD-10-CM | POA: Diagnosis not present

## 2015-10-29 DIAGNOSIS — F330 Major depressive disorder, recurrent, mild: Secondary | ICD-10-CM | POA: Diagnosis not present

## 2015-11-04 ENCOUNTER — Ambulatory Visit (INDEPENDENT_AMBULATORY_CARE_PROVIDER_SITE_OTHER): Payer: BLUE CROSS/BLUE SHIELD | Admitting: Obstetrics and Gynecology

## 2015-11-04 ENCOUNTER — Encounter: Payer: Self-pay | Admitting: Obstetrics and Gynecology

## 2015-11-04 VITALS — BP 106/69 | HR 69 | Ht 68.0 in | Wt 147.0 lb

## 2015-11-04 DIAGNOSIS — N920 Excessive and frequent menstruation with regular cycle: Secondary | ICD-10-CM

## 2015-11-04 DIAGNOSIS — F419 Anxiety disorder, unspecified: Secondary | ICD-10-CM | POA: Diagnosis not present

## 2015-11-04 NOTE — Progress Notes (Signed)
GYNECOLOGY PROGRESS NOTE  Subjective:    Patient ID: Whitney DellJennifer L [name], female    DOB: 1970/12/18, 45 y.o.   MRN: [id number]  HPI  Patient is a 45 y.o. 623P2012 female who presents as a referral from [facility] Naomie Dean(Carrie Doss, NP) for heavy menses. Patient's last menstrual period was 10/07/2015. Patient notes that her cycles have slowly gotten heavier over the past few years, however this year has been the worst.  Notes periods are starting come a little earlier and last longer (10 days).  In April patient notes that period was so heavy she almost considered going to the Emergency Room.  Currently notes changing a tampon every 1-2 hours with her cycles. Last 2 periods have eased up some, have not been as bad.  Had an ultrasound performed, however notes that her provider moved away prior to giving her the results.  Was referred to [facility] to discuss ultrasound results.     GYN History:  Last pap smear 12/2013, normal.  Last mammogram was abnormal (left breast with BIRADS 4), with stereotactic biopsy performed 02/2015.    OB History  Gravida Para Term Preterm AB SAB TAB Ectopic Multiple Living  3 2 2  1  1   2     # Outcome Date GA Lbr Len/2nd Weight Sex Delivery Anes PTL Lv  3 TAB           2 Term           1 Term               Past Medical History  Diagnosis Date  . Hx of migraine headaches   . Depression   . Anxiety   . Frequent headaches   . Migraines   . Urinary tract infection     Family History  Problem Relation Age of Onset  . Breast cancer Mother   . Diabetes Father   . Cancer Maternal Aunt     Lung Cancer   Past Surgical History  Procedure Laterality Date  . Breast biopsy Left 03/04/2015    path pending    Social History   Social History  . Marital Status: Married    Spouse Name: N/A  . Number of Children: N/A  . Years of Education: N/A   Occupational History  . Not on file.   Social History Main Topics  . Smoking status: Never Smoker    . Smokeless tobacco: Never Used  . Alcohol Use: No  . Drug Use: No  . Sexual Activity:    Partners: Male    Birth Control/ Protection: Surgical     Comment: Husband had vasectomy    Other Topics Concern  . Not on file   Social History Narrative   Works at [employer] as a Psychologist, forensiclayout clerk   Lives with her husband and 2 children.   Caffeine- 2 cups of tea.   Enjoys spending time with family.    Current Outpatient Prescriptions on File Prior to Visit  Medication Sig Dispense Refill  . ALPRAZolam (XANAX) 0.5 MG tablet Take 0.5 mg by mouth as needed.     . Ferrous Sulfate (IRON) 325 (65 FE) MG TABS Take 325 mg by mouth daily.    Marland Kitchen. MELATONIN PO Take 1 tablet by mouth at bedtime.    . sertraline (ZOLOFT) 100 MG tablet Take 200 mg by mouth daily.    . SUMAtriptan (IMITREX) 50 MG tablet Take 1 tablet (50 mg total) by mouth  once. May repeat in 2 hours if headache persists or recurs. 10 tablet 2  . topiramate (TOPAMAX) 25 MG tablet Take 1 tablet (25 mg total) by mouth at bedtime. 30 tablet 2   No current facility-administered medications on file prior to visit.    No Known Allergies   Review of Systems A comprehensive review of systems was negative except for: Genitourinary: positive for abnormal menstrual periods and hot flashes Neurological: positive for headaches Behavioral/Psych: positive for anxiety   Objective:   Blood pressure 106/69, pulse 69, height 5\' 8"  (1.727 m), weight 147 lb (66.679 kg), last menstrual period 10/07/2015. General appearance: alert and no distress  Neck: no adenopathy, no carotid bruit, no JVD, supple, symmetrical, trachea midline and thyroid not enlarged, symmetric, no tenderness/mass/nodules Lungs: clear to auscultation bilaterally Heart: regular rate and rhythm, S1, S2 normal, no murmur, click, rub or gallop Abdomen: soft, non-tender; bowel sounds normal; no masses,  no organomegaly Pelvic: deferred Extremities: extremities normal, atraumatic,  no cyanosis or edema Neurologic: Grossly normal    Labs:  Lab Results  Component Value Date   TSH 1.43 06/19/2015    Lab Results  Component Value Date   WBC 5.9 08/28/2015   HGB 11.3* 08/28/2015   HCT 33.7* 08/28/2015   MCV 88.6 08/28/2015   PLT 233.0 08/28/2015     Imaging (Pelvic Ultrasound 09/08/2015):  FINDINGS: Uterus  Measurements: 8.6 x 3.9 x 5.1 cm. No fibroids or other mass visualized.  Endometrium  Thickness: Maximally 11 mm, including on image 53. No focal abnormality visualized.  Right ovary  Measurements: 1.6 x 2.2 x 3.3 cm. A 1.6 cm corpus luteal cyst is identified within.  Left ovary  Measurements: 1.5 x 2.2 x 2.4 cm. Normal appearance/no adnexal mass.  Other findings  Trace free pelvic fluid is likely physiologic.  IMPRESSION: 1. Normal endometrium, without explanation for menorrhagia. 2. Right ovarian corpus luteal cyst. 3.Trace free pelvic fluid is likely physiologic.   Assessment:   Menorrhagia with regular cycle Anxiety  Plan:   - Patient has heavy uterine bleeding . She has a normal exam, no evidence of lesions.  Has had normal evaluation labs and pelvic ultrasound to evaluate for any structural gynecologic abnormalities. Discussed ultrasound results with patient.  Discussed management options for abnormal uterine bleeding including tranexamic acid (Lysteda), oral progesterone, Depo Provera, Mirena IUD, endometrial ablation (Novasure/Hydrothermal Ablation) or hysterectomy as definitive surgical management.  Discussed risks and benefits of each method.   Patient desires expectant management for now.  Notes that if bleeding continues to become progressively worse, will look into management options.  Will hold on endometrial biopsy unless patient desires surgical intervention, or if intermenstrual bleeding occurs.  Bleeding precautions reviewed. To call back if symptoms progress.  - Anxiety being managed by PCP    Hildred LaserAnika Jamieon Lannen,  MD [facility]

## 2015-11-12 ENCOUNTER — Telehealth: Payer: Self-pay | Admitting: Primary Care

## 2015-11-12 NOTE — Telephone Encounter (Signed)
-----   Message from Doreene NestKatherine K Canna Nickelson, NP sent at 10/15/2015  6:35 PM EDT ----- Regarding: Headaches Please check on Mrs. Whitney [name]'s headaches. How is she feeling since we started low dose Topamax?

## 2015-11-13 DIAGNOSIS — M5413 Radiculopathy, cervicothoracic region: Secondary | ICD-10-CM | POA: Diagnosis not present

## 2015-11-13 DIAGNOSIS — M9902 Segmental and somatic dysfunction of thoracic region: Secondary | ICD-10-CM | POA: Diagnosis not present

## 2015-11-13 DIAGNOSIS — M9901 Segmental and somatic dysfunction of cervical region: Secondary | ICD-10-CM | POA: Diagnosis not present

## 2015-11-13 NOTE — Telephone Encounter (Signed)
She is on a low dose of Topamax so I recommend she start taking 2 tablets by mouth at bedtime to see if she notices any improvement. Please have her provide us with an update in 2 weeks.

## 2015-11-13 NOTE — Telephone Encounter (Signed)
Pt returned your call Best number [phone]

## 2015-11-13 NOTE — Telephone Encounter (Signed)
Message left for patient to return my call.  

## 2015-11-13 NOTE — Telephone Encounter (Signed)
Spoken to patient regarding her headaches. Patient stated that she really did not noticed any different with Topamax. She had two headaches since she was seen in the office and noticed no difference.

## 2015-11-14 DIAGNOSIS — M9902 Segmental and somatic dysfunction of thoracic region: Secondary | ICD-10-CM | POA: Diagnosis not present

## 2015-11-14 DIAGNOSIS — M5413 Radiculopathy, cervicothoracic region: Secondary | ICD-10-CM | POA: Diagnosis not present

## 2015-11-14 NOTE — Telephone Encounter (Signed)
Message left for patient to return my call.  

## 2015-11-14 NOTE — Telephone Encounter (Signed)
Spoken and notified patient of Kate's comments. Patient verbalized understanding. 

## 2015-11-17 ENCOUNTER — Encounter: Payer: Self-pay | Admitting: Primary Care

## 2015-11-28 ENCOUNTER — Encounter: Payer: Self-pay | Admitting: Obstetrics and Gynecology

## 2015-12-10 DIAGNOSIS — F330 Major depressive disorder, recurrent, mild: Secondary | ICD-10-CM | POA: Diagnosis not present

## 2015-12-10 DIAGNOSIS — F411 Generalized anxiety disorder: Secondary | ICD-10-CM | POA: Diagnosis not present

## 2015-12-18 ENCOUNTER — Encounter: Payer: Self-pay | Admitting: Primary Care

## 2015-12-21 ENCOUNTER — Telehealth: Payer: Self-pay | Admitting: Primary Care

## 2015-12-21 DIAGNOSIS — G43901 Migraine, unspecified, not intractable, with status migrainosus: Secondary | ICD-10-CM

## 2015-12-21 MED ORDER — BUTALBITAL-APAP-CAFFEINE 50-325-40 MG PO TABS: 1.0000 | ORAL_TABLET | Freq: Four times a day (QID) | ORAL | 0 refills | Status: DC | PRN

## 2015-12-21 NOTE — Telephone Encounter (Signed)
Whitney [name], please call in Fioricet 50-325-40 mg tablets into the pharmacy. Take 1 tablet by mouth every 6 hours as needed for migraines. #20, no refills. Please call patient once completed. Thanks.

## 2015-12-22 NOTE — Telephone Encounter (Signed)
Called in medication to the pharmacy as instructed. 

## 2015-12-22 NOTE — Telephone Encounter (Signed)
Message left for patient to return my call.  

## 2015-12-24 NOTE — Telephone Encounter (Signed)
Message left for patient to return my call.'  Also sent patient a message on MyChart regarding her medication.

## 2015-12-25 NOTE — Telephone Encounter (Signed)
Noted. Patient viewed the message I sent on MyChart.

## 2015-12-28 ENCOUNTER — Other Ambulatory Visit: Payer: Self-pay | Admitting: Primary Care

## 2015-12-28 DIAGNOSIS — D509 Iron deficiency anemia, unspecified: Secondary | ICD-10-CM

## 2015-12-30 DIAGNOSIS — F330 Major depressive disorder, recurrent, mild: Secondary | ICD-10-CM | POA: Diagnosis not present

## 2015-12-30 DIAGNOSIS — F411 Generalized anxiety disorder: Secondary | ICD-10-CM | POA: Diagnosis not present

## 2015-12-31 ENCOUNTER — Other Ambulatory Visit: Payer: BLUE CROSS/BLUE SHIELD

## 2016-01-06 ENCOUNTER — Encounter: Payer: Self-pay | Admitting: Obstetrics and Gynecology

## 2016-01-06 ENCOUNTER — Encounter: Payer: Self-pay | Admitting: Primary Care

## 2016-01-06 ENCOUNTER — Ambulatory Visit (INDEPENDENT_AMBULATORY_CARE_PROVIDER_SITE_OTHER): Payer: BLUE CROSS/BLUE SHIELD | Admitting: Primary Care

## 2016-01-06 VITALS — BP 104/70 | HR 108 | Temp 98.6°F | Ht 68.0 in | Wt 149.8 lb

## 2016-01-06 DIAGNOSIS — Z0000 Encounter for general adult medical examination without abnormal findings: Secondary | ICD-10-CM | POA: Diagnosis not present

## 2016-01-06 DIAGNOSIS — K5901 Slow transit constipation: Secondary | ICD-10-CM

## 2016-01-06 DIAGNOSIS — E785 Hyperlipidemia, unspecified: Secondary | ICD-10-CM | POA: Diagnosis not present

## 2016-01-06 DIAGNOSIS — G43809 Other migraine, not intractable, without status migrainosus: Secondary | ICD-10-CM | POA: Diagnosis not present

## 2016-01-06 DIAGNOSIS — F418 Other specified anxiety disorders: Secondary | ICD-10-CM

## 2016-01-06 DIAGNOSIS — D509 Iron deficiency anemia, unspecified: Secondary | ICD-10-CM

## 2016-01-06 NOTE — Assessment & Plan Note (Signed)
Worried about combination of Zoloft, Cymbalta, Imitrex. Discussed risks of use and to use Imitrex sparingly. She has not tried Fioricet and will hold off for now.

## 2016-01-06 NOTE — Assessment & Plan Note (Addendum)
Managed on Zoloft, Cymbalta, Alprazolam per psychiatry. Denies SI/HI, feels well managed.

## 2016-01-06 NOTE — Progress Notes (Signed)
Subjective:    Patient ID: Whitney [name], female    DOB: November 24, 1970, 45 y.o.   MRN: [id number]  HPI  Ms. Whitney [name] is a 45 year old female who presents today for complete physical.  Immunizations: -Tetanus: Completed in 2016 -Influenza: Did not complete last season   Diet: She endorses a poor diet. Breakfast: Cereal Lunch: Fast food, restaurants, salads Dinner: Restaurants, fast food Snacks: Chips, chocolate Desserts: Daily Beverages: Water, sweet tea  Exercise: She does not currently exercise Eye exam: Completed in 2017 Dental exam: Competes semi-annually Pap Smear: Completed in 2015 Mammogram: Completed in October 2016, Diagnostic mammogram, Due in October 2017.   Review of Systems  Constitutional: Negative for unexpected weight change.  HENT: Negative for rhinorrhea.   Respiratory: Negative for cough and shortness of breath.   Cardiovascular: Negative for chest pain.  Gastrointestinal: Negative for constipation and diarrhea.       Bowel movements 5-7 day, normal for patient  Genitourinary: Negative for difficulty urinating and menstrual problem.  Musculoskeletal: Negative for arthralgias and myalgias.  Skin: Negative for rash.  Allergic/Immunologic: Positive for environmental allergies.  Neurological: Positive for headaches. Negative for dizziness and numbness.  Psychiatric/Behavioral:       Feels well managed per psychiatry       Past Medical History:  Diagnosis Date  . Anxiety   . Depression   . Frequent headaches   . Hx of migraine headaches   . Migraines   . Urinary tract infection      Social History   Social History  . Marital status: Married    Spouse name: N/A  . Number of children: N/A  . Years of education: N/A   Occupational History  . Not on file.   Social History Main Topics  . Smoking status: Never Smoker  . Smokeless tobacco: Never Used  . Alcohol use No  . Drug use: No  . Sexual activity: Yes    Partners: Male    Birth  control/ protection: Surgical     Comment: Husband had vasectomy    Other Topics Concern  . Not on file   Social History Narrative   Works at [employer] as a Psychologist, forensic   Lives with her husband and 2 children.   Caffeine- 2 cups of tea.   Enjoys spending time with family.    Past Surgical History:  Procedure Laterality Date  . BREAST BIOPSY Left 03/04/2015   path pending    Family History  Problem Relation Age of Onset  . Cancer Mother     Breast Cancer  . Breast cancer Mother   . Diabetes Father   . Cancer Maternal Aunt     Lung Cancer    No Known Allergies  Current Outpatient Prescriptions on File Prior to Visit  Medication Sig Dispense Refill  . ALPRAZolam (XANAX) 0.5 MG tablet Take 0.5 mg by mouth as needed.     . butalbital-acetaminophen-caffeine (FIORICET) 50-325-40 MG tablet Take 1 tablet by mouth every 6 (six) hours as needed for migraine. 20 tablet 0  . Ferrous Sulfate (IRON) 325 (65 FE) MG TABS Take 325 mg by mouth daily.    Marland Kitchen MELATONIN PO Take 1 tablet by mouth at bedtime.    . sertraline (ZOLOFT) 100 MG tablet Take 200 mg by mouth daily.    . SUMAtriptan (IMITREX) 50 MG tablet Take 1 tablet (50 mg total) by mouth once. May repeat in 2 hours if headache persists or recurs. 10 tablet  2   No current facility-administered medications on file prior to visit.     BP 104/70   Pulse (!) 108   Temp 98.6 F (37 C) (Oral)   Ht 5\' 8"  (1.727 m)   Wt 149 lb 12.8 oz (67.9 kg)   LMP 01/05/2016   SpO2 98%   BMI 22.78 kg/m    Objective:   Physical Exam  Constitutional: She is oriented to person, place, and time. She appears well-nourished.  HENT:  Right Ear: Tympanic membrane and ear canal normal.  Left Ear: Tympanic membrane and ear canal normal.  Nose: Nose normal.  Mouth/Throat: Oropharynx is clear and moist.  Eyes: Conjunctivae and EOM are normal. Pupils are equal, round, and reactive to light.  Neck: Neck supple. No thyromegaly present.    Cardiovascular: Normal rate and regular rhythm.   No murmur heard. Pulmonary/Chest: Effort normal and breath sounds normal. She has no rales.  Abdominal: Soft. Bowel sounds are normal. There is no tenderness.  Musculoskeletal: Normal range of motion.  Lymphadenopathy:    She has no cervical adenopathy.  Neurological: She is alert and oriented to person, place, and time. She has normal reflexes. No cranial nerve deficit.  Skin: Skin is warm and dry. No rash noted.  Psychiatric: She has a normal mood and affect.          Assessment & Plan:

## 2016-01-06 NOTE — Assessment & Plan Note (Signed)
Recheck IBC panel today. Heavy menstrual periods.

## 2016-01-06 NOTE — Patient Instructions (Addendum)
Schedule a lab only appointment at your convenience. Ensure you come fasting four hours prior to this appointment.  It is important that you improve your diet. Please limit fast food, fried foods, fatty foods, sugary drinks, etc. Increase your consumption of fresh fruits and vegetables, whole grains, lean protein.  Ensure you are consuming 64 ounces of water daily.  Start exercising. You should be getting 1 hour of moderate intensity exercise 3-5 days weekly.  Please call me if you do not receive notification for your mammogram in October.  Follow up in 1 year for repeat physical or sooner if needed.  It was a pleasure to see you today!

## 2016-01-06 NOTE — Progress Notes (Signed)
Pre visit review using our clinic review tool, if applicable. No additional management support is needed unless otherwise documented below in the visit note. 

## 2016-01-06 NOTE — Assessment & Plan Note (Signed)
Long history of. Bowel movements 5-7 days. Stable, likely due to diet. Discussed to increase fiber, water, exercise.

## 2016-01-06 NOTE — Assessment & Plan Note (Signed)
Tetanus UTD. Pap UTD. Mammogram due in October. Poor diet and does not regularly exercise. Discussed the importance of a healthy diet and regular exercise in order for weight loss and to reduce risk of other medical diseases. Exam unremarkable. Labs pending.  Follow up in 1 year for repeat physical.

## 2016-01-14 ENCOUNTER — Encounter: Payer: Self-pay | Admitting: Primary Care

## 2016-01-14 ENCOUNTER — Other Ambulatory Visit: Payer: Self-pay | Admitting: Internal Medicine

## 2016-01-14 DIAGNOSIS — Z0000 Encounter for general adult medical examination without abnormal findings: Secondary | ICD-10-CM

## 2016-01-16 ENCOUNTER — Other Ambulatory Visit: Payer: Self-pay | Admitting: Obstetrics and Gynecology

## 2016-01-16 MED ORDER — NORETHINDRONE 0.35 MG PO TABS: 1.0000 | ORAL_TABLET | Freq: Every day | ORAL | 3 refills | Status: DC

## 2016-01-21 ENCOUNTER — Other Ambulatory Visit (INDEPENDENT_AMBULATORY_CARE_PROVIDER_SITE_OTHER): Payer: BLUE CROSS/BLUE SHIELD

## 2016-01-21 DIAGNOSIS — Z0000 Encounter for general adult medical examination without abnormal findings: Secondary | ICD-10-CM

## 2016-01-21 LAB — COMPREHENSIVE METABOLIC PANEL
ALK PHOS: 36 U/L — AB (ref 39–117)
ALT: 10 U/L (ref 0–35)
AST: 12 U/L (ref 0–37)
Albumin: 4 g/dL (ref 3.5–5.2)
BUN: 16 mg/dL (ref 6–23)
CALCIUM: 8.6 mg/dL (ref 8.4–10.5)
CO2: 26 mEq/L (ref 19–32)
Chloride: 107 mEq/L (ref 96–112)
Creatinine, Ser: 0.77 mg/dL (ref 0.40–1.20)
GFR: 86.46 mL/min (ref >60.00)
GLUCOSE: 102 mg/dL — AB (ref 70–99)
POTASSIUM: 4.2 meq/L (ref 3.5–5.1)
Sodium: 138 mEq/L (ref 135–145)
TOTAL PROTEIN: 6.5 g/dL (ref 6.0–8.3)
Total Bilirubin: 0.5 mg/dL (ref 0.2–1.2)

## 2016-01-21 LAB — LIPID PANEL
Cholesterol: 199 mg/dL (ref 0–200)
HDL: 43.4 mg/dL (ref >39.00)
LDL Cholesterol: 136 mg/dL — ABNORMAL HIGH (ref 0–99)
NONHDL: 155.84
TRIGLYCERIDES: 99 mg/dL (ref 0.0–149.0)
Total CHOL/HDL Ratio: 5
VLDL: 19.8 mg/dL (ref 0.0–40.0)

## 2016-01-21 LAB — CBC
HEMATOCRIT: 40 % (ref 36.0–46.0)
HEMOGLOBIN: 13.8 g/dL (ref 12.0–15.0)
MCHC: 34.5 g/dL (ref 30.0–36.0)
MCV: 90.9 fl (ref 78.0–100.0)
PLATELETS: 183 10*3/uL (ref 150.0–400.0)
RBC: 4.4 Mil/uL (ref 3.87–5.11)
RDW: 13.7 % (ref 11.5–15.5)
WBC: 4.8 10*3/uL (ref 4.0–10.5)

## 2016-01-21 LAB — FERRITIN: FERRITIN: 20.4 ng/mL (ref 10.0–291.0)

## 2016-01-21 LAB — IBC PANEL
Iron: 126 ug/dL (ref 42–145)
Saturation Ratios: 40.2 % (ref 20.0–50.0)
Transferrin: 224 mg/dL (ref 212.0–360.0)

## 2016-01-22 ENCOUNTER — Other Ambulatory Visit: Payer: Self-pay | Admitting: Obstetrics and Gynecology

## 2016-01-22 DIAGNOSIS — Z1231 Encounter for screening mammogram for malignant neoplasm of breast: Secondary | ICD-10-CM

## 2016-01-23 ENCOUNTER — Encounter: Payer: Self-pay | Admitting: Primary Care

## 2016-02-11 DIAGNOSIS — F330 Major depressive disorder, recurrent, mild: Secondary | ICD-10-CM | POA: Diagnosis not present

## 2016-02-11 DIAGNOSIS — F411 Generalized anxiety disorder: Secondary | ICD-10-CM | POA: Diagnosis not present

## 2016-02-23 ENCOUNTER — Ambulatory Visit
Admission: RE | Admit: 2016-02-23 | Discharge: 2016-02-23 | Disposition: A | Discharge status: Home / Self Care | Payer: BLUE CROSS/BLUE SHIELD | Source: Ambulatory Visit | Attending: Obstetrics and Gynecology | Admitting: Obstetrics and Gynecology

## 2016-02-23 DIAGNOSIS — Z1231 Encounter for screening mammogram for malignant neoplasm of breast: Secondary | ICD-10-CM | POA: Insufficient documentation

## 2016-02-23 DIAGNOSIS — R922 Inconclusive mammogram: Secondary | ICD-10-CM | POA: Diagnosis not present

## 2016-02-24 ENCOUNTER — Other Ambulatory Visit: Payer: Self-pay | Admitting: Obstetrics and Gynecology

## 2016-02-24 DIAGNOSIS — N6320 Unspecified lump in the left breast, unspecified quadrant: Secondary | ICD-10-CM

## 2016-02-25 ENCOUNTER — Other Ambulatory Visit: Payer: Self-pay | Admitting: Obstetrics and Gynecology

## 2016-02-25 ENCOUNTER — Encounter: Payer: Self-pay | Admitting: Obstetrics and Gynecology

## 2016-02-25 DIAGNOSIS — R928 Other abnormal and inconclusive findings on diagnostic imaging of breast: Secondary | ICD-10-CM

## 2016-03-04 ENCOUNTER — Encounter: Payer: Self-pay | Admitting: Primary Care

## 2016-03-10 ENCOUNTER — Ambulatory Visit
Admission: RE | Admit: 2016-03-10 | Discharge: 2016-03-10 | Disposition: A | Discharge status: Home / Self Care | Payer: BLUE CROSS/BLUE SHIELD | Source: Ambulatory Visit | Attending: Obstetrics and Gynecology | Admitting: Obstetrics and Gynecology

## 2016-03-10 DIAGNOSIS — N6002 Solitary cyst of left breast: Secondary | ICD-10-CM | POA: Insufficient documentation

## 2016-03-10 DIAGNOSIS — N6320 Unspecified lump in the left breast, unspecified quadrant: Secondary | ICD-10-CM

## 2016-03-10 DIAGNOSIS — R928 Other abnormal and inconclusive findings on diagnostic imaging of breast: Secondary | ICD-10-CM | POA: Diagnosis not present

## 2016-03-10 DIAGNOSIS — N6489 Other specified disorders of breast: Secondary | ICD-10-CM | POA: Diagnosis not present

## 2016-04-14 ENCOUNTER — Encounter (INDEPENDENT_AMBULATORY_CARE_PROVIDER_SITE_OTHER): Payer: BLUE CROSS/BLUE SHIELD | Admitting: Obstetrics and Gynecology

## 2016-04-14 ENCOUNTER — Other Ambulatory Visit: Payer: Self-pay

## 2016-04-14 DIAGNOSIS — N920 Excessive and frequent menstruation with regular cycle: Secondary | ICD-10-CM

## 2016-04-14 MED ORDER — NORETHINDRONE 0.35 MG PO TABS: 1.0000 | ORAL_TABLET | Freq: Every day | ORAL | 0 refills | Status: DC

## 2016-04-30 ENCOUNTER — Other Ambulatory Visit: Payer: Self-pay | Admitting: Nurse Practitioner

## 2016-04-30 DIAGNOSIS — G43901 Migraine, unspecified, not intractable, with status migrainosus: Secondary | ICD-10-CM

## 2016-05-06 ENCOUNTER — Ambulatory Visit (INDEPENDENT_AMBULATORY_CARE_PROVIDER_SITE_OTHER): Payer: BLUE CROSS/BLUE SHIELD | Admitting: Obstetrics and Gynecology

## 2016-05-06 ENCOUNTER — Encounter: Payer: Self-pay | Admitting: Obstetrics and Gynecology

## 2016-05-06 VITALS — BP 119/79 | HR 89 | Ht 67.0 in | Wt 150.7 lb

## 2016-05-06 DIAGNOSIS — N920 Excessive and frequent menstruation with regular cycle: Secondary | ICD-10-CM

## 2016-05-06 NOTE — Progress Notes (Signed)
    GYNECOLOGY PROGRESS NOTE  Subjective:    Patient ID: Franz DellJennifer L Kost, female    DOB: 1970/12/21, 45 y.o.   MRN: [id number]  HPI  Patient is a 45 y.o. 583P2012 female who presents for 3 month f/u of menorrhagia.  Patient was placed on Progesterone-only OCPs for management. Patient does note some improvement in symptoms.  Notes that periods are shorter (prevoiusly 10 days, now only lasting 7 days), and her flow is somewhat lighter.    The following portions of the patient's history were reviewed and updated as appropriate: allergies, current medications, past family history, past medical history, past social history, past surgical history and problem list.  Review of Systems Pertinent items noted in HPI and remainder of comprehensive ROS otherwise negative.   Objective:   Blood pressure 119/79, pulse 89, height 5\' 7"  (1.702 m), weight 150 lb 11.2 oz (68.4 kg), last menstrual period 05/02/2016. General appearance: alert and no distress Exam deferred.    Assessment:   Menorrhagia with normal cycle.   Plan:   Patient notes progesterone OCPs have helped some, but does not feel that this is giving optimal management.  Desires to discuss alternatives again.  After discussion of all options, patient is now willing to try Mirena IUD. Return in 5 days for insertion.    A total of 15 minutes were spent face-to-face with the patient during this encounter and over half of that time involved counseling and coordination of care.  Hildred LaserAnika Engelbert Sevin, MD [facility]

## 2016-05-11 ENCOUNTER — Ambulatory Visit (INDEPENDENT_AMBULATORY_CARE_PROVIDER_SITE_OTHER): Payer: BLUE CROSS/BLUE SHIELD | Admitting: Obstetrics and Gynecology

## 2016-05-11 ENCOUNTER — Encounter: Payer: Self-pay | Admitting: Obstetrics and Gynecology

## 2016-05-11 VITALS — BP 104/67 | HR 102 | Ht 67.0 in | Wt 149.6 lb

## 2016-05-11 DIAGNOSIS — Z30430 Encounter for insertion of intrauterine contraceptive device: Secondary | ICD-10-CM

## 2016-05-11 DIAGNOSIS — N920 Excessive and frequent menstruation with regular cycle: Secondary | ICD-10-CM

## 2016-05-11 NOTE — Progress Notes (Signed)
    GYNECOLOGY OFFICE PROCEDURE NOTE  Whitney DellJennifer L [name] is a 46 y.o. [id number] here for Mirena IUD insertion. No GYN concerns.  Last pap smear was on 12/24/2013 and was normal.  Patient's last menstrual period was 05/10/2016 (exact date).   IUD Insertion Procedure Note Patient identified, informed consent performed, consent signed.   Discussed risks of irregular bleeding, cramping, infection, malpositioning or misplacement of the IUD outside the uterus which may require further procedure such as laparoscopy. Time out was performed.  Urine pregnancy test negative.  Speculum placed in the vagina.  Cervix visualized.  Cleaned with Betadine x 2.  Grasped anteriorly with a single tooth tenaculum.  Uterus sounded to 8 cm.  Mirena IUD placed per manufacturer's recommendations.  Strings trimmed to 3 cm. Tenaculum was removed, good hemostasis noted.  Patient tolerated procedure well.   Patient was given post-procedure instructions.  She was advised to have backup contraception for one week.  Patient was also asked to check IUD strings periodically and follow up in 4 weeks for IUD check.   Lot #: [id number] Exp: 08/2018

## 2016-06-10 ENCOUNTER — Other Ambulatory Visit: Payer: Self-pay | Admitting: Primary Care

## 2016-06-10 ENCOUNTER — Encounter: Payer: BLUE CROSS/BLUE SHIELD | Admitting: Obstetrics and Gynecology

## 2016-06-10 DIAGNOSIS — G43901 Migraine, unspecified, not intractable, with status migrainosus: Secondary | ICD-10-CM

## 2016-06-11 ENCOUNTER — Other Ambulatory Visit: Payer: Self-pay | Admitting: Primary Care

## 2016-06-11 DIAGNOSIS — G43901 Migraine, unspecified, not intractable, with status migrainosus: Secondary | ICD-10-CM

## 2016-06-11 MED ORDER — SUMATRIPTAN SUCCINATE 50 MG PO TABS: 50.0000 mg | ORAL_TABLET | Freq: Once | ORAL | 1 refills | Status: DC

## 2016-06-14 DIAGNOSIS — F411 Generalized anxiety disorder: Secondary | ICD-10-CM | POA: Diagnosis not present

## 2016-06-14 DIAGNOSIS — F330 Major depressive disorder, recurrent, mild: Secondary | ICD-10-CM | POA: Diagnosis not present

## 2016-06-14 DIAGNOSIS — G4700 Insomnia, unspecified: Secondary | ICD-10-CM | POA: Diagnosis not present

## 2016-06-22 ENCOUNTER — Encounter: Payer: BLUE CROSS/BLUE SHIELD | Admitting: Obstetrics and Gynecology

## 2016-06-29 ENCOUNTER — Encounter: Payer: Self-pay | Admitting: Obstetrics and Gynecology

## 2016-06-29 ENCOUNTER — Ambulatory Visit (INDEPENDENT_AMBULATORY_CARE_PROVIDER_SITE_OTHER): Payer: BLUE CROSS/BLUE SHIELD | Admitting: Obstetrics and Gynecology

## 2016-06-29 VITALS — BP 113/80 | HR 90 | Wt 148.6 lb

## 2016-06-29 DIAGNOSIS — Z30431 Encounter for routine checking of intrauterine contraceptive device: Secondary | ICD-10-CM | POA: Diagnosis not present

## 2016-06-29 NOTE — Progress Notes (Signed)
     GYNECOLOGY OFFICE PROGRESS NOTE  History:   46 y.o. [id number] here today for today for IUD string check; Mirena IUD was placed  05/11/2016. No complaints about the IUD, no concerning side effects.  Patient does still note that she has daily spotting, dark brown blood, but is improving.   The following portions of the patient's history were reviewed and updated as appropriate: allergies, current medications, past family history, past medical history, past social history, past surgical history and problem list. Last pap smear on 12/2013 was normal, negative HRHPV.  Review of Systems:   Pertinent items are noted in HPI.   Objective:   Physical Exam Blood pressure 113/80, pulse 90, weight 148 lb 9.6 oz (67.4 kg). CONSTITUTIONAL: Well-developed, well-nourished female in no acute distress.  ABDOMEN: Soft, no distention noted.   PELVIC: Normal appearing external genitalia; normal appearing vaginal mucosa and cervix.  IUD strings visualized, about 3 cm in length outside cervix.  EXTREMITIES: extremities normal, atraumatic, no cyanosis or edema NEUROLOGIC: Grossly intact and normal  Assessment & Plan:  Normal IUD check. Patient to keep IUD in place for five years; can come in for removal if she desires pregnancy within the next five years. Reiterated that with an IUD patient may experience abnormal spotting or bleeding up to ~ 3 months from insertion.  Routine preventative health maintenance measures emphasized.    Hildred LaserAnika Roxie Kreeger, MD [facility]

## 2016-07-12 ENCOUNTER — Encounter: Payer: Self-pay | Admitting: Obstetrics and Gynecology

## 2016-09-15 ENCOUNTER — Encounter: Payer: Self-pay | Admitting: Primary Care

## 2016-09-15 DIAGNOSIS — G43901 Migraine, unspecified, not intractable, with status migrainosus: Secondary | ICD-10-CM

## 2016-09-15 MED ORDER — SUMATRIPTAN SUCCINATE 50 MG PO TABS: 50.0000 mg | ORAL_TABLET | Freq: Once | ORAL | 1 refills | Status: DC

## 2016-09-30 DIAGNOSIS — F330 Major depressive disorder, recurrent, mild: Secondary | ICD-10-CM | POA: Diagnosis not present

## 2016-09-30 DIAGNOSIS — F411 Generalized anxiety disorder: Secondary | ICD-10-CM | POA: Diagnosis not present

## 2016-09-30 DIAGNOSIS — G4700 Insomnia, unspecified: Secondary | ICD-10-CM | POA: Diagnosis not present

## 2016-12-13 IMAGING — MG DIGITAL SCREENING BILATERAL MAMMOGRAM WITH CAD
4 series · 4 of 4 positions shown · non-contrast
Comparison: Previous exam(s).

CLINICAL DATA: Screening.

EXAM:
DIGITAL SCREENING BILATERAL MAMMOGRAM WITH CAD

[R CC]
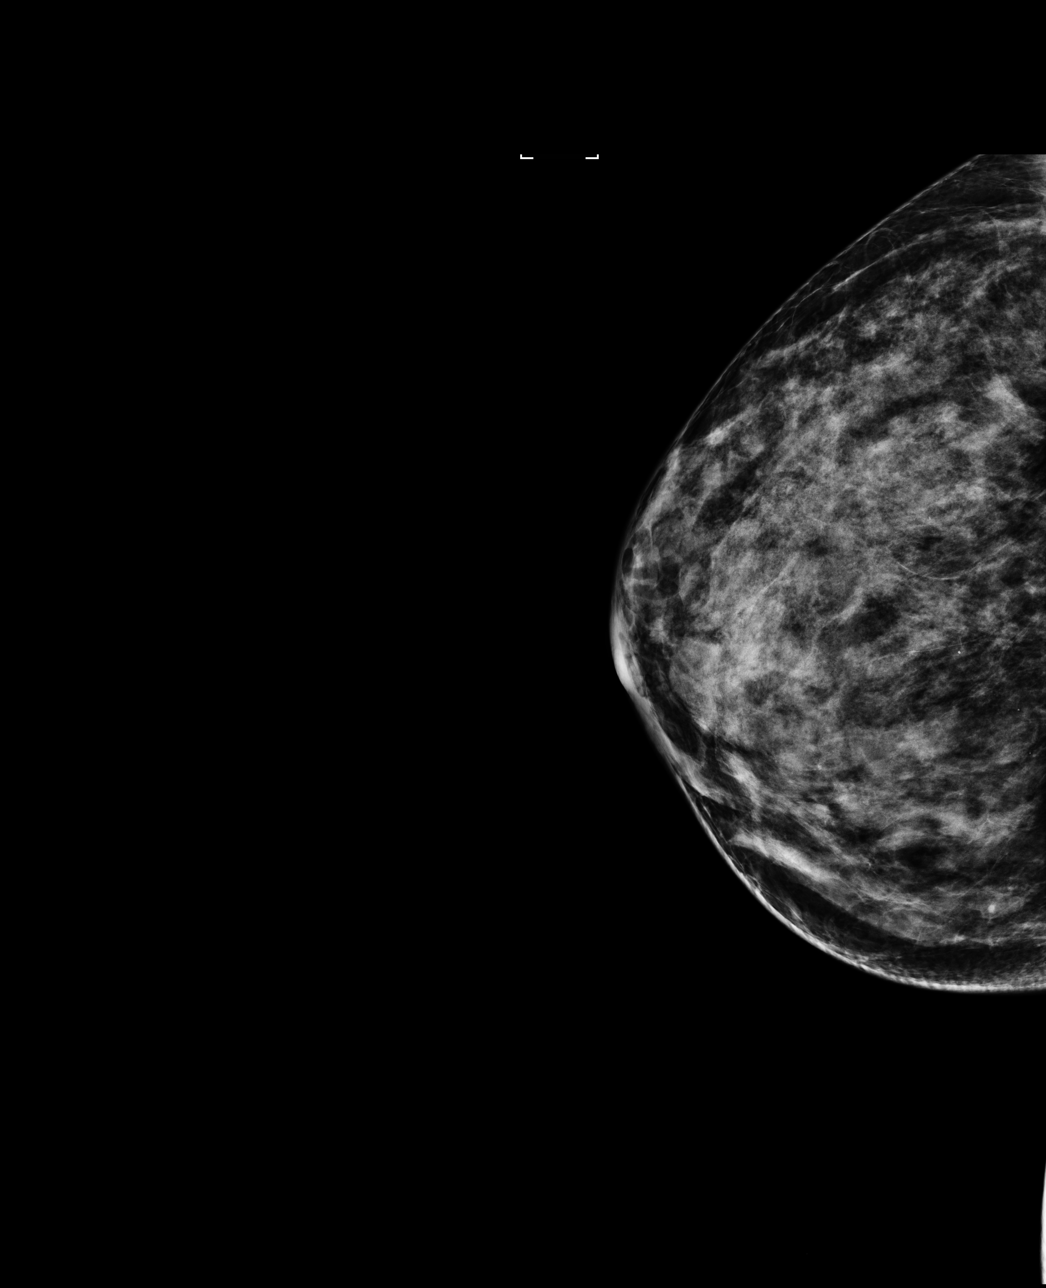

[R MLO]
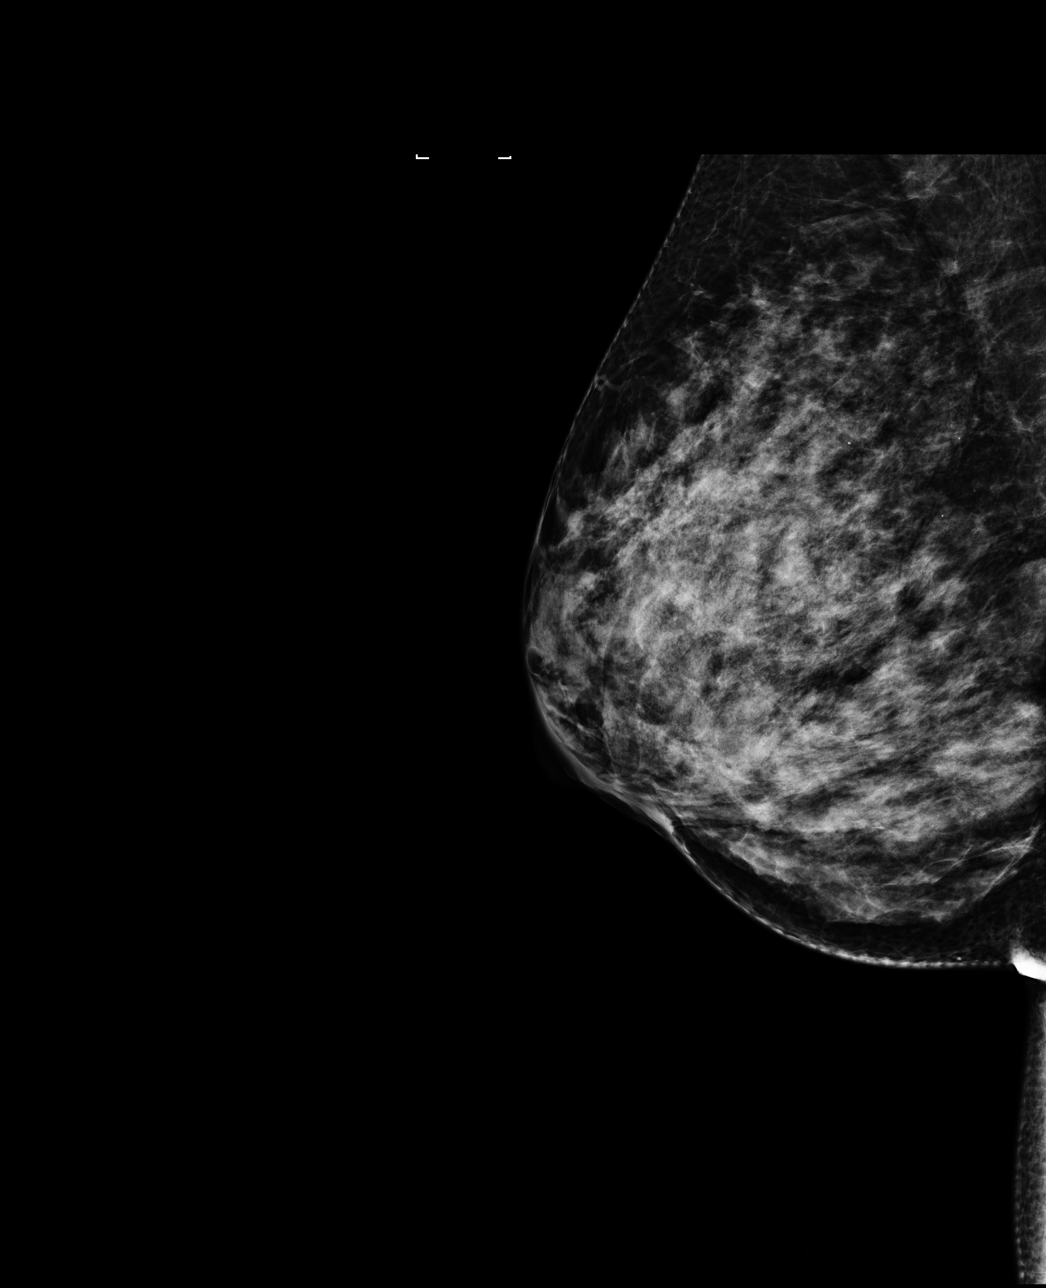

[L CC]
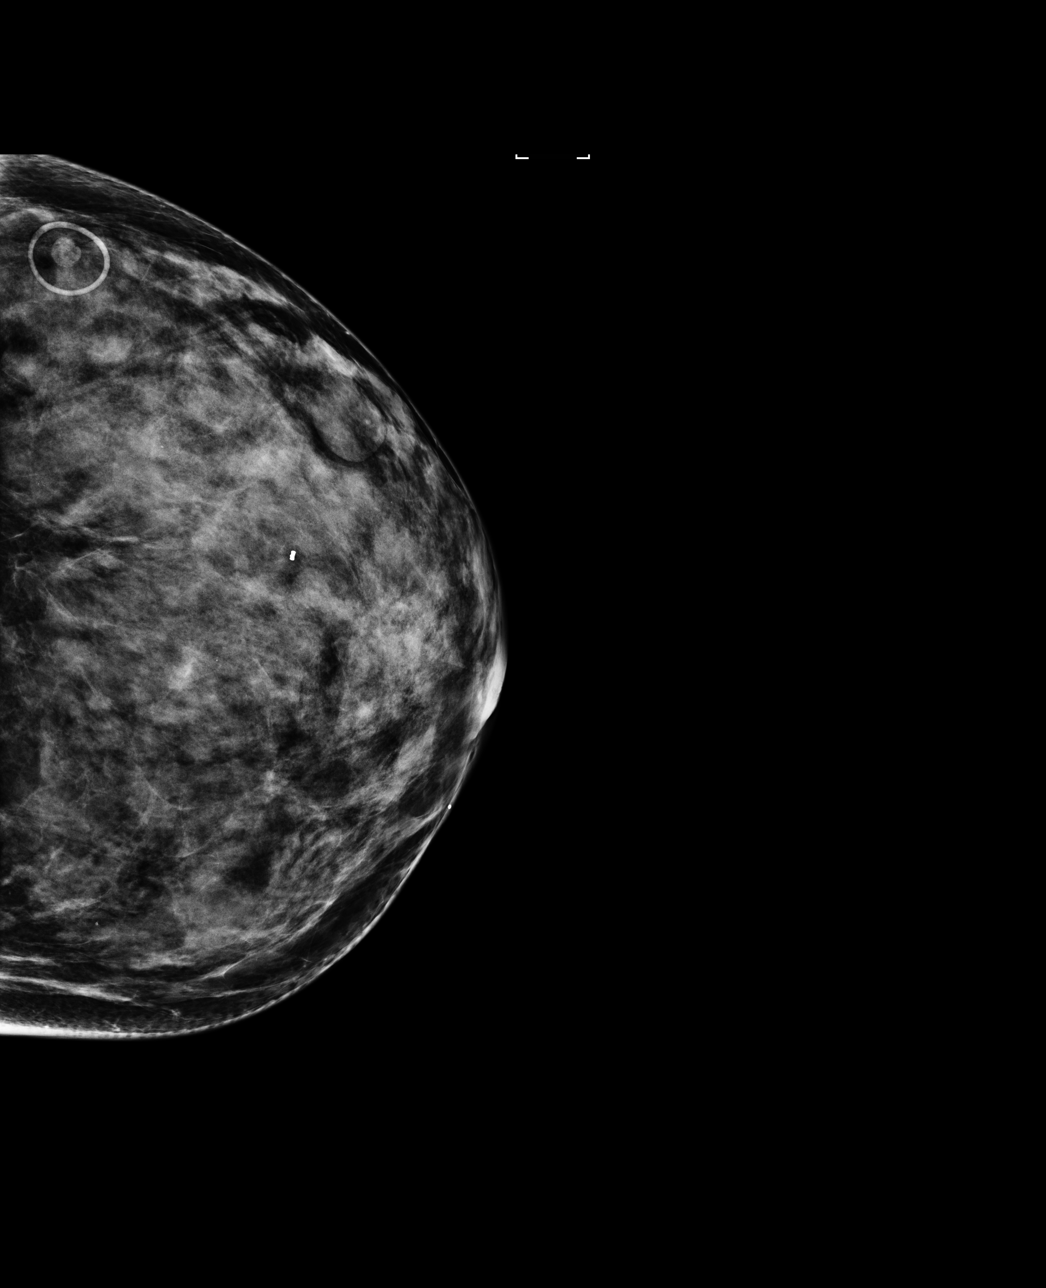

[L MLO]
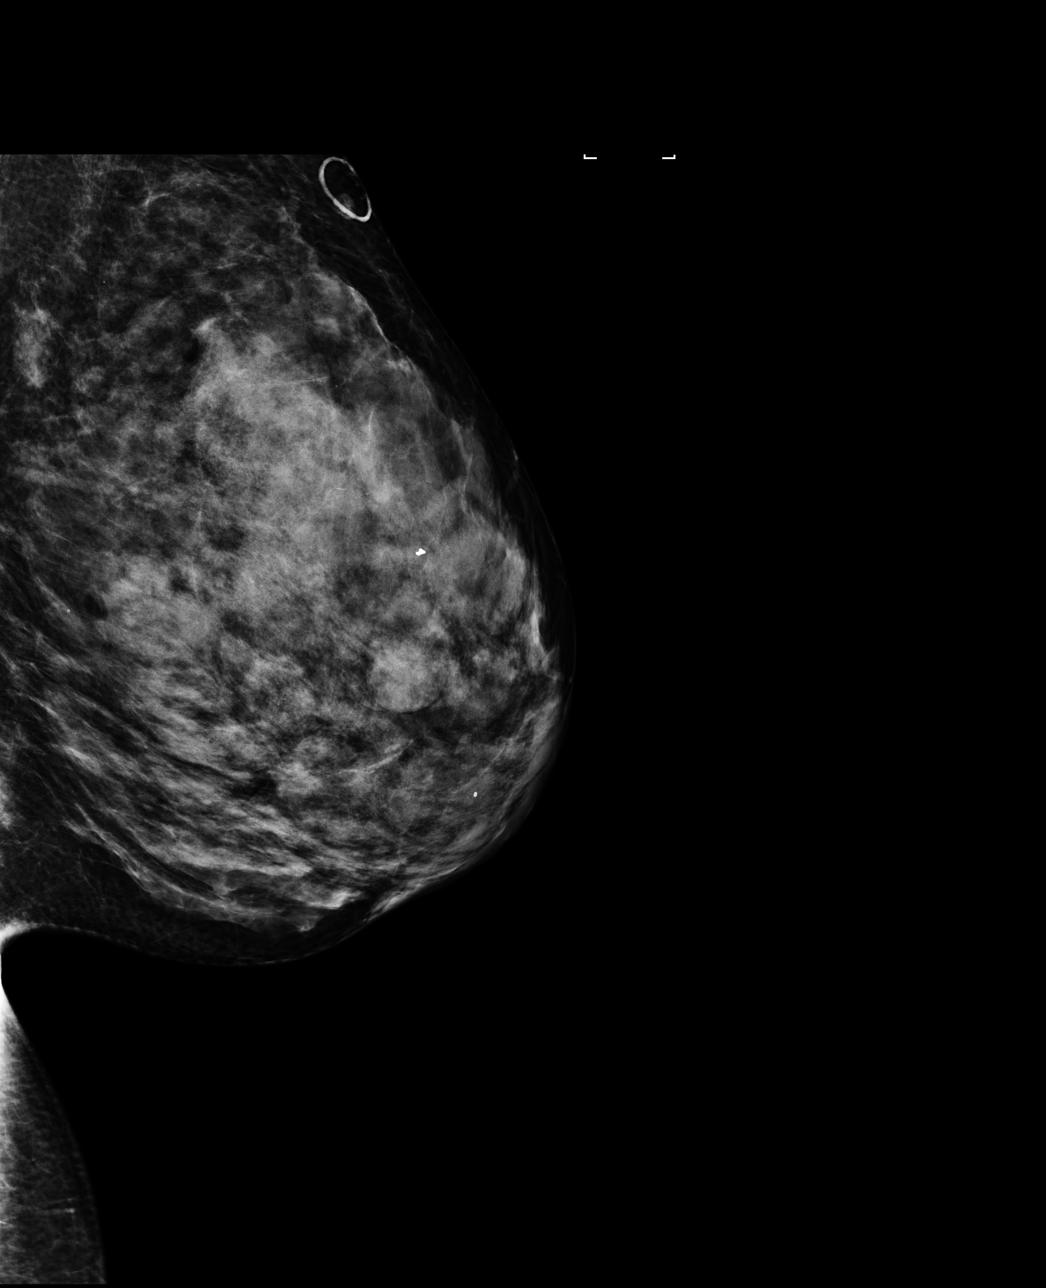

[4 of 4 positions shown; findings below may reference images not displayed]

ACR Breast Density Category d: The breast tissue is extremely dense,
which lowers the sensitivity of mammography.
FINDINGS: In the left breast, a possible mass warrants further evaluation. In
the right breast, no findings suspicious for malignancy.

Images were processed with CAD.
IMPRESSION: Further evaluation is suggested for possible mass in the left
breast.

RECOMMENDATION:
Diagnostic mammogram and possibly ultrasound of the left breast.
(Code:K5-5-HHA)

The patient will be contacted regarding the findings, and additional
imaging will be scheduled.

BI-RADS CATEGORY  0: Incomplete. Need additional imaging evaluation
and/or prior mammograms for comparison.

## 2016-12-27 ENCOUNTER — Other Ambulatory Visit: Payer: Self-pay | Admitting: Internal Medicine

## 2016-12-27 DIAGNOSIS — Z0000 Encounter for general adult medical examination without abnormal findings: Secondary | ICD-10-CM

## 2016-12-28 ENCOUNTER — Other Ambulatory Visit: Payer: Self-pay | Admitting: Primary Care

## 2016-12-28 DIAGNOSIS — G43901 Migraine, unspecified, not intractable, with status migrainosus: Secondary | ICD-10-CM

## 2017-01-05 ENCOUNTER — Other Ambulatory Visit: Payer: BLUE CROSS/BLUE SHIELD

## 2017-01-07 ENCOUNTER — Encounter: Payer: BLUE CROSS/BLUE SHIELD | Admitting: Primary Care

## 2017-01-20 ENCOUNTER — Other Ambulatory Visit: Payer: BLUE CROSS/BLUE SHIELD

## 2017-01-24 ENCOUNTER — Other Ambulatory Visit: Payer: BLUE CROSS/BLUE SHIELD

## 2017-01-24 DIAGNOSIS — G4700 Insomnia, unspecified: Secondary | ICD-10-CM | POA: Diagnosis not present

## 2017-01-24 DIAGNOSIS — F411 Generalized anxiety disorder: Secondary | ICD-10-CM | POA: Diagnosis not present

## 2017-01-24 DIAGNOSIS — F330 Major depressive disorder, recurrent, mild: Secondary | ICD-10-CM | POA: Diagnosis not present

## 2017-01-25 ENCOUNTER — Encounter: Payer: Self-pay | Admitting: Primary Care

## 2017-01-25 ENCOUNTER — Ambulatory Visit (INDEPENDENT_AMBULATORY_CARE_PROVIDER_SITE_OTHER): Payer: BLUE CROSS/BLUE SHIELD | Admitting: Primary Care

## 2017-01-25 VITALS — BP 108/70 | HR 96 | Temp 98.8°F | Ht 67.0 in | Wt 152.4 lb

## 2017-01-25 DIAGNOSIS — F418 Other specified anxiety disorders: Secondary | ICD-10-CM

## 2017-01-25 DIAGNOSIS — Z0000 Encounter for general adult medical examination without abnormal findings: Secondary | ICD-10-CM | POA: Diagnosis not present

## 2017-01-25 DIAGNOSIS — G43809 Other migraine, not intractable, without status migrainosus: Secondary | ICD-10-CM

## 2017-01-25 DIAGNOSIS — D500 Iron deficiency anemia secondary to blood loss (chronic): Secondary | ICD-10-CM | POA: Diagnosis not present

## 2017-01-25 LAB — LIPID PANEL
CHOL/HDL RATIO: 4
Cholesterol: 207 mg/dL — ABNORMAL HIGH (ref 0–200)
HDL: 53 mg/dL (ref >39.00)
LDL Cholesterol: 131 mg/dL — ABNORMAL HIGH (ref 0–99)
NONHDL: 154.09
TRIGLYCERIDES: 113 mg/dL (ref 0.0–149.0)
VLDL: 22.6 mg/dL (ref 0.0–40.0)

## 2017-01-25 LAB — COMPREHENSIVE METABOLIC PANEL
ALT: 11 U/L (ref 0–35)
AST: 12 U/L (ref 0–37)
Albumin: 4.1 g/dL (ref 3.5–5.2)
Alkaline Phosphatase: 40 U/L (ref 39–117)
BILIRUBIN TOTAL: 0.5 mg/dL (ref 0.2–1.2)
BUN: 14 mg/dL (ref 6–23)
CO2: 27 meq/L (ref 19–32)
Calcium: 9.4 mg/dL (ref 8.4–10.5)
Chloride: 106 mEq/L (ref 96–112)
Creatinine, Ser: 0.76 mg/dL (ref 0.40–1.20)
GFR: 87.37 mL/min (ref >60.00)
GLUCOSE: 92 mg/dL (ref 70–99)
Potassium: 4.6 mEq/L (ref 3.5–5.1)
Sodium: 139 mEq/L (ref 135–145)
Total Protein: 6.8 g/dL (ref 6.0–8.3)

## 2017-01-25 LAB — CBC
HCT: 42.7 % (ref 36.0–46.0)
HEMOGLOBIN: 14.2 g/dL (ref 12.0–15.0)
MCHC: 33.2 g/dL (ref 30.0–36.0)
MCV: 92.1 fl (ref 78.0–100.0)
Platelets: 221 10*3/uL (ref 150.0–400.0)
RBC: 4.63 Mil/uL (ref 3.87–5.11)
RDW: 13.3 % (ref 11.5–15.5)
WBC: 5.6 10*3/uL (ref 4.0–10.5)

## 2017-01-25 NOTE — Progress Notes (Signed)
Subjective:    Patient ID: CEYLIN DREIBELBIS, female    DOB: 06-Mar-1971, 46 y.o.   MRN: [id number]  HPI  Ms. Rossie is a 46 year old female who presents today for complete physical.  Immunizations: -Tetanus: Completed in 2016 -Influenza: Declines  Diet: She endorses a poor diet. Breakfast: Oatmeal, cereal, pastry  Lunch: Restaurants  Dinner: Tacos, pasta, meat, vegetable  Snacks: None Desserts: Daily Beverages: Water, sweet tea, occasionally soda  Exercise: She does not currently exercise Eye exam: Completed in 2017 Dental exam: Completes annually Pap Smear: Completed in 2015, following with GYN. Mammogram: Completed in November 2017, due this Fall   Review of Systems  Constitutional: Negative for unexpected weight change.  HENT: Negative for rhinorrhea.   Respiratory: Negative for cough and shortness of breath.   Cardiovascular: Negative for chest pain.  Gastrointestinal: Negative for constipation and diarrhea.  Genitourinary: Negative for difficulty urinating.       Menorrhagia improved with IUD.  Musculoskeletal: Negative for arthralgias and myalgias.  Skin: Negative for rash.  Allergic/Immunologic: Negative for environmental allergies.  Neurological: Negative for dizziness, numbness and headaches.       Taking BC powder daily for migraine prevention.   Psychiatric/Behavioral:       Following with psychiatry, feels well managed.       Past Medical History:  Diagnosis Date  . Anxiety   . Depression   . Frequent headaches   . Hx of migraine headaches   . Migraines   . Urinary tract infection      Social History   Social History  . Marital status: Married    Spouse name: N/A  . Number of children: N/A  . Years of education: N/A   Occupational History  . Not on file.   Social History Main Topics  . Smoking status: Never Smoker  . Smokeless tobacco: Never Used  . Alcohol use No  . Drug use: No  . Sexual activity: Yes    Partners: Male   Birth control/ protection: Surgical, IUD     Comment: Husband had vasectomy    Other Topics Concern  . Not on file   Social History Narrative   Works at [employer] as a Psychologist, forensic   Lives with her husband and 2 children.   Caffeine- 2 cups of tea.   Enjoys spending time with family.    Past Surgical History:  Procedure Laterality Date  . BREAST BIOPSY Left 03/04/2015   FIBROCYSTIC CHANGES    Family History  Problem Relation Age of Onset  . Cancer Mother        Breast Cancer  . Breast cancer Mother 42  . Diabetes Father   . Cancer Maternal Aunt        Lung Cancer    No Known Allergies  Current Outpatient Prescriptions on File Prior to Visit  Medication Sig Dispense Refill  . ALPRAZolam (XANAX) 0.5 MG tablet Take 0.5 mg by mouth as needed.     . DULoxetine (CYMBALTA) 60 MG capsule Take 60 mg by mouth daily.    . Ferrous Sulfate (IRON) 325 (65 FE) MG TABS Take 325 mg by mouth daily.    Marland Kitchen levonorgestrel (MIRENA) 20 MCG/24HR IUD 1 each by Intrauterine route once.    Marland Kitchen MELATONIN PO Take 1 tablet by mouth at bedtime.    . sertraline (ZOLOFT) 100 MG tablet Take 200 mg by mouth daily.    . SUMAtriptan (IMITREX) 50 MG tablet TAKE ONE TABLET  BY MOUTH -MAY REPEAT IN TWO HOURS IF HEADACHE PERSISTS OR RECURS 10 tablet 1   No current facility-administered medications on file prior to visit.     BP 108/70   Pulse 96   Temp 98.8 F (37.1 C) (Oral)   Ht  (1.702 m)   Wt 152 lb 6.4 oz (69.1 kg)   SpO2 97%   BMI 23.87 kg/m    Objective:   Physical Exam  Constitutional: She is oriented to person, place, and time. She appears well-nourished.  HENT:  Right Ear: Tympanic membrane and ear canal normal.  Left Ear: Tympanic membrane and ear canal normal.  Nose: Nose normal.  Mouth/Throat: Oropharynx is clear and moist.  Eyes: Pupils are equal, round, and reactive to light. Conjunctivae and EOM are normal.  Neck: Neck supple. No thyromegaly present.    Cardiovascular: Normal rate and regular rhythm.   No murmur heard. Pulmonary/Chest: Effort normal and breath sounds normal. She has no rales.  Abdominal: Soft. Bowel sounds are normal. There is no tenderness.  Musculoskeletal: Normal range of motion.  Lymphadenopathy:    She has no cervical adenopathy.  Neurological: She is alert and oriented to person, place, and time. She has normal reflexes. No cranial nerve deficit.  Skin: Skin is warm and dry. No rash noted.  Psychiatric: She has a normal mood and affect.          Assessment & Plan:

## 2017-01-25 NOTE — Patient Instructions (Signed)
Complete lab work prior to leaving today. I will notify you of your results once received.   Start exercising. You should be getting 150 minutes of moderate intensity exercise weekly.  Increase consumption of vegetables, fruit, whole grains.  Ensure you are consuming 64 ounces of water daily.  Follow up with GYN for your Pap Smear and Mammogram.   Follow up in 1 year for your annual exam or sooner if needed.  It was a pleasure to see you today!

## 2017-01-25 NOTE — Assessment & Plan Note (Signed)
Following with psychiatry, feels well managed. 

## 2017-01-25 NOTE — Assessment & Plan Note (Signed)
No recent migraines, however, taking BC powder daily. Discouraged use, discussed to start weaning down.

## 2017-01-25 NOTE — Assessment & Plan Note (Signed)
Td UTD, declines influenza vaccination. Pap due, following with GYN. Mammogram due this Fall, following with GYN. Recommended regular exercise, increase in fruit/vegetables/whole grains. Exam unremarkable. Labs pending. Follow up in 1 year.

## 2017-01-25 NOTE — Assessment & Plan Note (Addendum)
Had Mirena inserted in early 2018 due to menorrhagia with regular cycles. Still experiencing monthly cycles but less bleeding. Check CBC today.

## 2017-02-22 ENCOUNTER — Other Ambulatory Visit: Payer: Self-pay | Admitting: Obstetrics and Gynecology

## 2017-02-22 DIAGNOSIS — Z1231 Encounter for screening mammogram for malignant neoplasm of breast: Secondary | ICD-10-CM

## 2017-03-10 ENCOUNTER — Ambulatory Visit
Admission: RE | Admit: 2017-03-10 | Discharge: 2017-03-10 | Disposition: A | Discharge status: Home / Self Care | Payer: BLUE CROSS/BLUE SHIELD | Source: Ambulatory Visit | Attending: Obstetrics and Gynecology | Admitting: Obstetrics and Gynecology

## 2017-03-10 DIAGNOSIS — Z1231 Encounter for screening mammogram for malignant neoplasm of breast: Secondary | ICD-10-CM

## 2017-04-02 DIAGNOSIS — N390 Urinary tract infection, site not specified: Secondary | ICD-10-CM | POA: Diagnosis not present

## 2017-04-05 ENCOUNTER — Encounter: Payer: Self-pay | Admitting: Primary Care

## 2017-04-05 DIAGNOSIS — N390 Urinary tract infection, site not specified: Secondary | ICD-10-CM

## 2017-04-05 MED ORDER — CEPHALEXIN 500 MG PO CAPS: 500.0000 mg | ORAL_CAPSULE | Freq: Two times a day (BID) | ORAL | 0 refills | Status: DC

## 2017-04-06 ENCOUNTER — Other Ambulatory Visit: Payer: Self-pay | Admitting: Primary Care

## 2017-04-06 DIAGNOSIS — G43901 Migraine, unspecified, not intractable, with status migrainosus: Secondary | ICD-10-CM

## 2017-05-17 DIAGNOSIS — F411 Generalized anxiety disorder: Secondary | ICD-10-CM | POA: Diagnosis not present

## 2017-05-17 DIAGNOSIS — G4700 Insomnia, unspecified: Secondary | ICD-10-CM | POA: Diagnosis not present

## 2017-05-17 DIAGNOSIS — F330 Major depressive disorder, recurrent, mild: Secondary | ICD-10-CM | POA: Diagnosis not present

## 2017-07-04 ENCOUNTER — Other Ambulatory Visit: Payer: Self-pay | Admitting: Primary Care

## 2017-07-04 DIAGNOSIS — G43901 Migraine, unspecified, not intractable, with status migrainosus: Secondary | ICD-10-CM

## 2017-08-23 ENCOUNTER — Other Ambulatory Visit: Payer: Self-pay | Admitting: Obstetrics and Gynecology

## 2017-08-23 DIAGNOSIS — Z1231 Encounter for screening mammogram for malignant neoplasm of breast: Secondary | ICD-10-CM

## 2017-10-04 DIAGNOSIS — G4700 Insomnia, unspecified: Secondary | ICD-10-CM | POA: Diagnosis not present

## 2017-10-04 DIAGNOSIS — F330 Major depressive disorder, recurrent, mild: Secondary | ICD-10-CM | POA: Diagnosis not present

## 2017-10-04 DIAGNOSIS — F411 Generalized anxiety disorder: Secondary | ICD-10-CM | POA: Diagnosis not present

## 2017-10-05 ENCOUNTER — Encounter: Payer: BLUE CROSS/BLUE SHIELD | Admitting: Obstetrics and Gynecology

## 2017-11-09 ENCOUNTER — Encounter: Payer: BLUE CROSS/BLUE SHIELD | Admitting: Obstetrics and Gynecology

## 2017-12-06 ENCOUNTER — Encounter: Payer: BLUE CROSS/BLUE SHIELD | Admitting: Obstetrics and Gynecology

## 2017-12-28 ENCOUNTER — Encounter: Payer: BLUE CROSS/BLUE SHIELD | Admitting: Obstetrics and Gynecology

## 2018-01-04 ENCOUNTER — Encounter: Payer: BLUE CROSS/BLUE SHIELD | Admitting: Obstetrics and Gynecology

## 2018-01-12 ENCOUNTER — Encounter: Payer: BLUE CROSS/BLUE SHIELD | Admitting: Obstetrics and Gynecology

## 2018-01-24 DIAGNOSIS — F330 Major depressive disorder, recurrent, mild: Secondary | ICD-10-CM | POA: Diagnosis not present

## 2018-01-24 DIAGNOSIS — F411 Generalized anxiety disorder: Secondary | ICD-10-CM | POA: Diagnosis not present

## 2018-01-24 DIAGNOSIS — G4700 Insomnia, unspecified: Secondary | ICD-10-CM | POA: Diagnosis not present

## 2018-02-07 ENCOUNTER — Other Ambulatory Visit: Payer: Self-pay | Admitting: Primary Care

## 2018-02-07 DIAGNOSIS — Z1231 Encounter for screening mammogram for malignant neoplasm of breast: Secondary | ICD-10-CM

## 2018-02-10 ENCOUNTER — Encounter: Payer: BLUE CROSS/BLUE SHIELD | Admitting: Obstetrics and Gynecology

## 2018-02-12 ENCOUNTER — Other Ambulatory Visit: Payer: Self-pay | Admitting: Primary Care

## 2018-02-12 DIAGNOSIS — G43901 Migraine, unspecified, not intractable, with status migrainosus: Secondary | ICD-10-CM

## 2018-03-16 ENCOUNTER — Other Ambulatory Visit: Payer: Self-pay

## 2018-03-16 DIAGNOSIS — G43901 Migraine, unspecified, not intractable, with status migrainosus: Secondary | ICD-10-CM

## 2018-03-16 NOTE — Telephone Encounter (Signed)
Name of Medication: imitrex 50 mg Name of Pharmacy: [facility] Last Fill or Written Date and Quantity: # 10 on 02/13/18  Last Office Visit and Type:01/25/17 annual Next Office Visit and Type: none scheduled

## 2018-03-17 NOTE — Telephone Encounter (Signed)
Refill provided one month ago, she shouldn't need refills at this time. This is likely an automatic refill request. Will deny request.

## 2018-03-21 ENCOUNTER — Encounter: Payer: BLUE CROSS/BLUE SHIELD | Admitting: Obstetrics and Gynecology

## 2018-03-27 DIAGNOSIS — G43901 Migraine, unspecified, not intractable, with status migrainosus: Secondary | ICD-10-CM

## 2018-03-31 MED ORDER — SUMATRIPTAN SUCCINATE 50 MG PO TABS: ORAL_TABLET | ORAL | 0 refills | Status: DC

## 2018-04-11 ENCOUNTER — Ambulatory Visit
Admission: RE | Admit: 2018-04-11 | Discharge: 2018-04-11 | Disposition: A | Discharge status: Home / Self Care | Payer: BLUE CROSS/BLUE SHIELD | Source: Ambulatory Visit | Attending: Primary Care | Admitting: Primary Care

## 2018-04-11 ENCOUNTER — Ambulatory Visit (INDEPENDENT_AMBULATORY_CARE_PROVIDER_SITE_OTHER): Payer: BLUE CROSS/BLUE SHIELD | Admitting: Obstetrics and Gynecology

## 2018-04-11 ENCOUNTER — Encounter: Payer: Self-pay | Admitting: Obstetrics and Gynecology

## 2018-04-11 ENCOUNTER — Other Ambulatory Visit (HOSPITAL_COMMUNITY)
Admission: RE | Admit: 2018-04-11 | Discharge: 2018-04-11 | Disposition: A | Discharge status: Home / Self Care | Payer: BLUE CROSS/BLUE SHIELD | Source: Ambulatory Visit | Attending: Obstetrics and Gynecology | Admitting: Obstetrics and Gynecology

## 2018-04-11 VITALS — BP 117/75 | HR 114 | Ht 67.0 in | Wt 162.3 lb

## 2018-04-11 DIAGNOSIS — R922 Inconclusive mammogram: Secondary | ICD-10-CM

## 2018-04-11 DIAGNOSIS — Z803 Family history of malignant neoplasm of breast: Secondary | ICD-10-CM

## 2018-04-11 DIAGNOSIS — Z1239 Encounter for other screening for malignant neoplasm of breast: Secondary | ICD-10-CM

## 2018-04-11 DIAGNOSIS — Z01419 Encounter for gynecological examination (general) (routine) without abnormal findings: Secondary | ICD-10-CM | POA: Diagnosis not present

## 2018-04-11 DIAGNOSIS — Z975 Presence of (intrauterine) contraceptive device: Secondary | ICD-10-CM | POA: Diagnosis not present

## 2018-04-11 DIAGNOSIS — Z1231 Encounter for screening mammogram for malignant neoplasm of breast: Secondary | ICD-10-CM | POA: Insufficient documentation

## 2018-04-11 DIAGNOSIS — E785 Hyperlipidemia, unspecified: Secondary | ICD-10-CM

## 2018-04-11 NOTE — Patient Instructions (Signed)

## 2018-04-11 NOTE — Progress Notes (Signed)
GYNECOLOGY ANNUAL PHYSICAL EXAM PROGRESS NOTE  Subjective:    Whitney [name] is a 47 y.o. G70P2012 female who presents for an annual exam. The patient has no complaints today. The patient is sexually active.  The patient wears seatbelts: yes. The patient participates in regular exercise: no. Has the patient ever been transfused or tattooed?: no. The patient reports that there is not domestic violence in her life.    Gynecologic History  Menarche age: 61 Patient's last menstrual period was 04/03/2018. Cycles are 4 days, q monthly, light flow.  Contraception: IUD - Mirena inserted 05/2016. History of STI's: Denies Last Pap: 12/24/2013. Results were: normal.  Denies h/o abnormal pap smears. Last mammogram: 03/2017. Results were: normal   OB History  Gravida Para Term Preterm AB Living  3 2 2  0 1 2  SAB TAB Ectopic Multiple Live Births  0 1 0 0 0    # Outcome Date GA Lbr Len/2nd Weight Sex Delivery Anes PTL Lv  3 TAB           2 Term           1 Term             Past Medical History:  Diagnosis Date  . Anxiety   . Depression   . Frequent headaches   . Hx of migraine headaches   . Migraines   . Urinary tract infection     Past Surgical History:  Procedure Laterality Date  . BREAST BIOPSY Left 03/04/2015   FIBROCYSTIC CHANGES stereo    Family History  Problem Relation Age of Onset  . Breast cancer Mother 62  . Diabetes Father   . Breast cancer Maternal Aunt 61       Lung Cancer    Social History   Socioeconomic History  . Marital status: Married    Spouse name: Not on file  . Number of children: Not on file  . Years of education: Not on file  . Highest education level: Not on file  Occupational History  . Not on file  Social Needs  . Financial resource strain: Not on file  . Food insecurity:    Worry: Not on file    Inability: Not on file  . Transportation needs:    Medical: Not on file    Non-medical: Not on file  Tobacco Use  . Smoking status:  Never Smoker  . Smokeless tobacco: Never Used  Substance and Sexual Activity  . Alcohol use: No    Alcohol/week: 0.0 standard drinks  . Drug use: No  . Sexual activity: Yes    Partners: Male    Birth control/protection: Surgical, IUD    Comment: Husband had vasectomy   Lifestyle  . Physical activity:    Days per week: Not on file    Minutes per session: Not on file  . Stress: Not on file  Relationships  . Social connections:    Talks on phone: Not on file    Gets together: Not on file    Attends religious service: Not on file    Active member of club or organization: Not on file    Attends meetings of clubs or organizations: Not on file    Relationship status: Not on file  . Intimate partner violence:    Fear of current or ex partner: Not on file    Emotionally abused: Not on file    Physically abused: Not on file    Forced sexual  activity: Not on file  Other Topics Concern  . Not on file  Social History Narrative   Works at [employer] as a Psychologist, forensic   Lives with her husband and 2 children.   Caffeine- 2 cups of tea.   Enjoys spending time with family.    Current Outpatient Medications on File Prior to Visit  Medication Sig Dispense Refill  . ALPRAZolam (XANAX) 0.5 MG tablet Take 0.5 mg by mouth as needed.     . DULoxetine (CYMBALTA) 60 MG capsule Take 60 mg by mouth daily.    . Ferrous Sulfate (IRON) 325 (65 FE) MG TABS Take 325 mg by mouth daily.    Marland Kitchen levonorgestrel (MIRENA) 20 MCG/24HR IUD 1 each by Intrauterine route once.    Marland Kitchen MELATONIN PO Take 1 tablet by mouth at bedtime.    . sertraline (ZOLOFT) 100 MG tablet Take 200 mg by mouth daily.    . SUMAtriptan (IMITREX) 50 MG tablet TAKE 1 TABLET BY MOUTH-MAY REPEAT IN 2 HOURS IF HEADACHE PERSISTS OR RECURS 10 tablet 0   No current facility-administered medications on file prior to visit.     No Known Allergies    Review of Systems Constitutional: negative for chills, fatigue, fevers and  sweats Eyes: negative for irritation, redness and visual disturbance Ears, nose, mouth, throat, and face: negative for hearing loss, nasal congestion, snoring and tinnitus Respiratory: negative for asthma, cough, sputum Cardiovascular: negative for chest pain, dyspnea, exertional chest pressure/discomfort, irregular heart beat, palpitations and syncope Gastrointestinal: negative for abdominal pain, change in bowel habits, nausea and vomiting Genitourinary: negative for abnormal menstrual periods, genital lesions, sexual problems and vaginal discharge, dysuria and urinary incontinence Integument/breast: negative for breast lump, breast tenderness and nipple discharge Hematologic/lymphatic: negative for bleeding and easy bruising Musculoskeletal:negative for back pain and muscle weakness Neurological: negative for dizziness, headaches, vertigo and weakness Endocrine: negative for diabetic symptoms including polydipsia, polyuria and skin dryness Allergic/Immunologic: negative for hay fever and urticaria        Objective:  Blood pressure 117/75, pulse (!) 114, height 5\' 7"  (1.702 m), weight 162 lb 4.8 oz (73.6 kg), last menstrual period 04/03/2018. Body mass index is 25.42 kg/m.   General Appearance:    Alert, cooperative, no distress, appears stated age.   Head:    Normocephalic, without obvious abnormality, atraumatic  Eyes:    PERRL, conjunctiva/corneas clear, EOM's intact, both eyes  Ears:    Normal external ear canals, both ears  Nose:   Nares normal, septum midline, mucosa normal, no drainage or sinus tenderness  Throat:   Lips, mucosa, and tongue normal; teeth and gums normal  Neck:   Supple, symmetrical, trachea midline, no adenopathy; thyroid: no enlargement/tenderness/nodules; no carotid bruit or JVD  Back:     Symmetric, no curvature, ROM normal, no CVA tenderness  Lungs:     Clear to auscultation bilaterally, respirations unlabored  Chest Wall:    No tenderness or deformity    Heart:    Regular rhythm, S1 and S2 normal, no murmur, rub or gallop. Mild tachycardia (pulse 104 on exam).   Breast Exam:    No tenderness, masses, or nipple abnormality. Fibrocystic changes noted in right breast.   Abdomen:     Soft, non-tender, bowel sounds active all four quadrants, no masses, no organomegaly.    Genitalia:    Pelvic:external genitalia normal, vagina without lesions, discharge, or tenderness, rectovaginal septum  normal. Cervix normal in appearance, no cervical motion tenderness, IUD threads visualized ~  3 cm in length, no adnexal masses or tenderness.  Uterus normal size, shape, mobile, regular contours, nontender.  Rectal:    Normal external sphincter.  No hemorrhoids appreciated. Internal exam not done.   Extremities:   Extremities normal, atraumatic, no cyanosis or edema  Pulses:   2+ and symmetric all extremities  Skin:   Skin color, texture, turgor normal, no rashes or lesions  Lymph nodes:   Cervical, supraclavicular, and axillary nodes normal  Neurologic:   CNII-XII intact, normal strength, sensation and reflexes throughout   .  Labs:  Lab Results  Component Value Date   WBC 5.6 01/25/2017   HGB 14.2 01/25/2017   HCT 42.7 01/25/2017   MCV 92.1 01/25/2017   PLT 221.0 01/25/2017    Lab Results  Component Value Date   CREATININE 0.76 01/25/2017   BUN 14 01/25/2017   NA 139 01/25/2017   K 4.6 01/25/2017   CL 106 01/25/2017   CO2 27 01/25/2017    Lab Results  Component Value Date   ALT 11 01/25/2017   AST 12 01/25/2017   ALKPHOS 40 01/25/2017   BILITOT 0.5 01/25/2017    Lab Results  Component Value Date   TSH 1.43 06/19/2015    Lab Results  Component Value Date   CHOL 207 (H) 01/25/2017   HDL 53.00 01/25/2017   LDLCALC 131 (H) 01/25/2017   TRIG 113.0 01/25/2017   CHOLHDL 4 01/25/2017    Assessment:   Encounter for well woman exam with routine gynecological exam  Dyslipidemia IUD (intrauterine device) in place Family history of breast  cancer in first degree relative Dense breast tissue on mammogram  Plan:     Blood tests: Comprehensive metabolic panel and Lipoproteins. Breast self exam technique reviewed and patient encouraged to perform self-exam monthly. Contraception: IUD. Mirena due for removal 2023. Discussed healthy lifestyle modifications. Encouraged exercise and low-fat/cholesterol diet. Mammogram scheduled for today. Pap smear performed today.   Discussion had on patient's family history of breast cancer. Mother diagnosed in early 6750's, currently deceased. Now notes a maternal aunt who was recently diagnosed in early 1160s. Advised that based on having 2 family members with breast cancer she qualifies for hereditary genetic screening. Discussed risks/benefits of screening, especially in the setting of patient having dense breasts which can obscure small masses. Given handout on screening, also offered referral to genetic counselor for more information if desired. Patient to notify MD if testing is desired.  Declines flu vaccine. Follow up in 1 year for annual exam.     Hildred Laserherry, Ryosuke Ericksen, MD [facility]

## 2018-04-11 NOTE — Progress Notes (Signed)
PT is present today for her annual exam. Pt stated that she has been doing self-breast exams monthly. Pt stated that she is doing well and denies any issues. No problems or concerns.     

## 2018-04-12 LAB — COMPREHENSIVE METABOLIC PANEL
A/G RATIO: 1.9 (ref 1.2–2.2)
ALBUMIN: 3.9 g/dL (ref 3.5–5.5)
ALT: 16 IU/L (ref 0–32)
AST: 16 IU/L (ref 0–40)
Alkaline Phosphatase: 54 IU/L (ref 39–117)
BILIRUBIN TOTAL: 0.2 mg/dL (ref 0.0–1.2)
BUN/Creatinine Ratio: 17 (ref 9–23)
BUN: 13 mg/dL (ref 6–24)
CHLORIDE: 104 mmol/L (ref 96–106)
CO2: 24 mmol/L (ref 20–29)
Calcium: 9 mg/dL (ref 8.7–10.2)
Creatinine, Ser: 0.76 mg/dL (ref 0.57–1.00)
GFR calc non Af Amer: 94 mL/min/{1.73_m2} (ref >59)
GFR, EST AFRICAN AMERICAN: 109 mL/min/{1.73_m2} (ref >59)
Globulin, Total: 2.1 g/dL (ref 1.5–4.5)
Glucose: 74 mg/dL (ref 65–99)
POTASSIUM: 3.8 mmol/L (ref 3.5–5.2)
Sodium: 142 mmol/L (ref 134–144)
TOTAL PROTEIN: 6 g/dL (ref 6.0–8.5)

## 2018-04-12 LAB — LIPID PANEL
CHOLESTEROL TOTAL: 202 mg/dL — AB (ref 100–199)
Chol/HDL Ratio: 5.1 ratio — ABNORMAL HIGH (ref 0.0–4.4)
HDL: 40 mg/dL (ref >39)
LDL Calculated: 132 mg/dL — ABNORMAL HIGH (ref 0–99)
Triglycerides: 150 mg/dL — ABNORMAL HIGH (ref 0–149)
VLDL Cholesterol Cal: 30 mg/dL (ref 5–40)

## 2018-04-14 LAB — CYTOLOGY - PAP
Adequacy: ABSENT
Diagnosis: NEGATIVE
HPV: NOT DETECTED

## 2018-04-27 ENCOUNTER — Ambulatory Visit: Payer: BLUE CROSS/BLUE SHIELD | Admitting: Primary Care

## 2018-04-27 ENCOUNTER — Encounter: Payer: Self-pay | Admitting: Primary Care

## 2018-04-27 DIAGNOSIS — F418 Other specified anxiety disorders: Secondary | ICD-10-CM | POA: Diagnosis not present

## 2018-04-27 DIAGNOSIS — D500 Iron deficiency anemia secondary to blood loss (chronic): Secondary | ICD-10-CM

## 2018-04-27 DIAGNOSIS — G43901 Migraine, unspecified, not intractable, with status migrainosus: Secondary | ICD-10-CM

## 2018-04-27 NOTE — Assessment & Plan Note (Signed)
Feels well managed on current regimen, continue psychiatry follow up.

## 2018-04-27 NOTE — Assessment & Plan Note (Signed)
Monitored by GYN, no longer taking oral iron tablets.

## 2018-04-27 NOTE — Patient Instructions (Signed)
Please notify me or your pharmacy when you need a refill of your Imitrex.  It was a pleasure to see you today!

## 2018-04-27 NOTE — Progress Notes (Signed)
Subjective:    Patient ID: Franz DellJennifer L Epting, female    DOB: 09-22-1970, 47 y.o.   MRN: [id number]  HPI  Ms. Adron BeneHensley is a 47 year old female who presents today for follow up and medication refill.  1) Depression and Anxiety: Currently managed on duloxetine 60 mg daily, sertraline 100 mg daily, and alprazolam PRN. She is following with psychiatry and feels well managed.  2) Migraines: Chronic. Intermittent. Occur during menstrual cycle time, occurs only for one day and will dissipate with one 50 mg sumatriptan dose. Her migraines are located behind her right eye with some phonophobia and nausea. She has about 8 tablets remaining on her current prescription but will need a new prescription thereafter. Feels well managed on her current regimen.  Review of Systems  Neurological: Negative for headaches.       Intermittent migraines  Psychiatric/Behavioral:       Feels well managed on current regimen       Past Medical History:  Diagnosis Date  . Anxiety   . Depression   . Frequent headaches   . Hx of migraine headaches   . Migraines   . Urinary tract infection      Social History   Socioeconomic History  . Marital status: Married    Spouse name: Not on file  . Number of children: Not on file  . Years of education: Not on file  . Highest education level: Not on file  Occupational History  . Not on file  Social Needs  . Financial resource strain: Not on file  . Food insecurity:    Worry: Not on file    Inability: Not on file  . Transportation needs:    Medical: Not on file    Non-medical: Not on file  Tobacco Use  . Smoking status: Never Smoker  . Smokeless tobacco: Never Used  Substance and Sexual Activity  . Alcohol use: No    Alcohol/week: 0.0 standard drinks  . Drug use: No  . Sexual activity: Yes    Partners: Male    Birth control/protection: Surgical, I.U.D.    Comment: Husband had vasectomy   Lifestyle  . Physical activity:    Days per week: Not on file     Minutes per session: Not on file  . Stress: Not on file  Relationships  . Social connections:    Talks on phone: Not on file    Gets together: Not on file    Attends religious service: Not on file    Active member of club or organization: Not on file    Attends meetings of clubs or organizations: Not on file    Relationship status: Not on file  . Intimate partner violence:    Fear of current or ex partner: Not on file    Emotionally abused: Not on file    Physically abused: Not on file    Forced sexual activity: Not on file  Other Topics Concern  . Not on file  Social History Narrative   Works at [employer] as a Psychologist, forensiclayout clerk   Lives with her husband and 2 children.   Caffeine- 2 cups of tea.   Enjoys spending time with family.    Past Surgical History:  Procedure Laterality Date  . BREAST BIOPSY Left 03/04/2015   FIBROCYSTIC CHANGES stereo    Family History  Problem Relation Age of Onset  . Breast cancer Mother [id number]  . Diabetes Father   . Breast cancer Maternal  Aunt 61       Lung Cancer    No Known Allergies  Current Outpatient Medications on File Prior to Visit  Medication Sig Dispense Refill  . ALPRAZolam (XANAX) 0.5 MG tablet Take 0.5 mg by mouth as needed.     . DULoxetine (CYMBALTA) 60 MG capsule Take 60 mg by mouth daily.    Marland Kitchen. levonorgestrel (MIRENA) 20 MCG/24HR IUD 1 each by Intrauterine route once.    Marland Kitchen. MELATONIN PO Take 1 tablet by mouth at bedtime.    . sertraline (ZOLOFT) 100 MG tablet Take 200 mg by mouth daily.    . SUMAtriptan (IMITREX) 50 MG tablet TAKE 1 TABLET BY MOUTH-MAY REPEAT IN 2 HOURS IF HEADACHE PERSISTS OR RECURS 10 tablet 0   No current facility-administered medications on file prior to visit.     BP 116/72   Pulse 77   Temp 98 F (36.7 C) (Oral)   Ht 5\' 7"  (1.702 m)   Wt 162 lb 8 oz (73.7 kg)   LMP 04/03/2018   SpO2 99%   BMI 25.45 kg/m    Objective:   Physical Exam  Constitutional: She appears well-nourished.    Neck: Neck supple.  Cardiovascular: Normal rate and regular rhythm.  Respiratory: Effort normal and breath sounds normal.  Skin: Skin is warm and dry.  Psychiatric: She has a normal mood and affect.           Assessment & Plan:

## 2018-04-27 NOTE — Assessment & Plan Note (Signed)
Occur monthly around menstrual cycles, doing well on sumatriptan 50 mg dose and doesn't require a second tablet. Continue same. She will request refills when needed.

## 2018-05-23 DIAGNOSIS — G4700 Insomnia, unspecified: Secondary | ICD-10-CM | POA: Diagnosis not present

## 2018-05-23 DIAGNOSIS — F330 Major depressive disorder, recurrent, mild: Secondary | ICD-10-CM | POA: Diagnosis not present

## 2018-05-23 DIAGNOSIS — F411 Generalized anxiety disorder: Secondary | ICD-10-CM | POA: Diagnosis not present

## 2018-05-30 ENCOUNTER — Other Ambulatory Visit: Payer: Self-pay

## 2018-05-30 DIAGNOSIS — G43901 Migraine, unspecified, not intractable, with status migrainosus: Secondary | ICD-10-CM

## 2018-05-30 MED ORDER — SUMATRIPTAN SUCCINATE 50 MG PO TABS: ORAL_TABLET | ORAL | 0 refills | Status: DC

## 2018-05-30 NOTE — Telephone Encounter (Signed)
Noted.  Refill sent to pharmacy. 

## 2018-05-30 NOTE — Telephone Encounter (Signed)
Name of Medication: sumatriptan 50 mg [facility]  Last Fill or Written Date and Quantity: # 10 on 03/31/18 Last Office Visit and Type: 04/27/18 FU med refill Next Office Visit and Type: none scheduled Last Controlled Substance Agreement Date: none Last UDS: none

## 2018-07-24 ENCOUNTER — Other Ambulatory Visit: Payer: Self-pay

## 2018-07-24 DIAGNOSIS — G43901 Migraine, unspecified, not intractable, with status migrainosus: Secondary | ICD-10-CM

## 2018-07-25 ENCOUNTER — Other Ambulatory Visit: Payer: Self-pay

## 2018-07-25 DIAGNOSIS — G43901 Migraine, unspecified, not intractable, with status migrainosus: Secondary | ICD-10-CM

## 2018-07-25 MED ORDER — SUMATRIPTAN SUCCINATE 50 MG PO TABS: ORAL_TABLET | ORAL | 0 refills | Status: DC

## 2018-07-25 NOTE — Telephone Encounter (Signed)
Does she actually need a refill? She told me in January that she needs one tablet once monthly around her menstrual cycle.

## 2018-07-25 NOTE — Telephone Encounter (Signed)
Last prescribed on 05/30/2018. Last seen on 04/27/2018. No future appointment

## 2018-07-26 NOTE — Telephone Encounter (Signed)
Tried call patient and was hung up on 2 times

## 2018-07-26 NOTE — Telephone Encounter (Signed)
Send patient a message through MyChart. 

## 2018-08-22 ENCOUNTER — Other Ambulatory Visit: Payer: Self-pay | Admitting: Primary Care

## 2018-08-22 DIAGNOSIS — G43901 Migraine, unspecified, not intractable, with status migrainosus: Secondary | ICD-10-CM

## 2018-08-22 NOTE — Telephone Encounter (Signed)
Noted.  Refill sent pharmacy. 

## 2018-08-22 NOTE — Telephone Encounter (Signed)
Last prescribed on 04/27/2018. Last office visit on 07/25/2018. No future appointment

## 2018-09-17 ENCOUNTER — Other Ambulatory Visit: Payer: Self-pay | Admitting: Primary Care

## 2018-09-17 DIAGNOSIS — G43901 Migraine, unspecified, not intractable, with status migrainosus: Secondary | ICD-10-CM

## 2018-09-18 DIAGNOSIS — G4700 Insomnia, unspecified: Secondary | ICD-10-CM | POA: Diagnosis not present

## 2018-09-18 DIAGNOSIS — F330 Major depressive disorder, recurrent, mild: Secondary | ICD-10-CM | POA: Diagnosis not present

## 2018-09-18 DIAGNOSIS — F411 Generalized anxiety disorder: Secondary | ICD-10-CM | POA: Diagnosis not present

## 2018-10-04 ENCOUNTER — Telehealth: Payer: BLUE CROSS/BLUE SHIELD | Admitting: Family

## 2018-10-04 DIAGNOSIS — R399 Unspecified symptoms and signs involving the genitourinary system: Secondary | ICD-10-CM

## 2018-10-04 MED ORDER — CEPHALEXIN 500 MG PO CAPS
500.0000 mg | ORAL_CAPSULE | Freq: Two times a day (BID) | ORAL | 0 refills | Status: DC
Start: 2018-10-04 — End: 2019-12-27

## 2018-10-04 NOTE — Progress Notes (Signed)

## 2019-01-01 DIAGNOSIS — F330 Major depressive disorder, recurrent, mild: Secondary | ICD-10-CM | POA: Diagnosis not present

## 2019-01-01 DIAGNOSIS — G4700 Insomnia, unspecified: Secondary | ICD-10-CM | POA: Diagnosis not present

## 2019-01-01 DIAGNOSIS — F411 Generalized anxiety disorder: Secondary | ICD-10-CM | POA: Diagnosis not present

## 2019-01-23 ENCOUNTER — Telehealth: Payer: BLUE CROSS/BLUE SHIELD | Admitting: Physician Assistant

## 2019-01-23 DIAGNOSIS — R059 Cough, unspecified: Secondary | ICD-10-CM

## 2019-01-23 DIAGNOSIS — Z20828 Contact with and (suspected) exposure to other viral communicable diseases: Secondary | ICD-10-CM

## 2019-01-23 DIAGNOSIS — Z20822 Contact with and (suspected) exposure to covid-19: Secondary | ICD-10-CM

## 2019-01-23 DIAGNOSIS — R05 Cough: Secondary | ICD-10-CM

## 2019-01-23 MED ORDER — BENZONATATE 100 MG PO CAPS
100.0000 mg | ORAL_CAPSULE | Freq: Three times a day (TID) | ORAL | 0 refills | Status: DC | PRN
Start: 2019-01-23 — End: 2019-12-27

## 2019-01-23 NOTE — Progress Notes (Signed)
E-Visit for Corona Virus Screening   Your current symptoms could be consistent with the coronavirus.  Many health care providers can now test patients at their office but not all are.  [facility] has multiple testing sites. For information on our COVID testing locations and hours go to [email or web address]  Please quarantine yourself while awaiting your test results.  We are enrolling you in our MyChart Home Montioring for COVID19 . Daily you will receive a questionnaire within the MyChart website. Our COVID 19 response team willl be monitoriing your responses daily.    COVID-19 is a respiratory illness with symptoms that are similar to the flu. Symptoms are typically mild to moderate, but there have been cases of severe illness and death due to the virus. The following symptoms may appear 2-14 days after exposure: . Fever . Cough . Shortness of breath or difficulty breathing . Chills . Repeated shaking with chills . Muscle pain . Headache . Sore throat . New loss of taste or smell . Fatigue . Congestion or runny nose . Nausea or vomiting . Diarrhea  It is vitally important that if you feel that you have an infection such as this virus or any other virus that you stay home and away from places where you may spread it to others.  You should self-quarantine for 14 days if you have symptoms that could potentially be coronavirus or have been in close contact a with a person diagnosed with COVID-19 within the last 2 weeks. You should avoid contact with people age 65 and older.   You should wear a mask or cloth face covering over your nose and mouth if you must be around other people or animals, including pets (even at home). Try to stay at least 6 feet away from other people. This will protect the people around you.  You can use medication such as A prescription cough medication called Tessalon Perles 100 mg. You may take 1-2 capsules every 8 hours as needed for  cough  You may also take acetaminophen (Tylenol) as needed for fever.   Reduce your risk of any infection by using the same precautions used for avoiding the common cold or flu:  . Wash your hands often with soap and warm water for at least 20 seconds.  If soap and water are not readily available, use an alcohol-based hand sanitizer with at least 60% alcohol.  . If coughing or sneezing, cover your mouth and nose by coughing or sneezing into the elbow areas of your shirt or coat, into a tissue or into your sleeve (not your hands). . Avoid shaking hands with others and consider head nods or verbal greetings only. . Avoid touching your eyes, nose, or mouth with unwashed hands.  . Avoid close contact with people who are sick. . Avoid places or events with large numbers of people in one location, like concerts or sporting events. . Carefully consider travel plans you have or are making. . If you are planning any travel outside or inside the [place], visit the CDC's Travelers' Health webpage for the latest health notices. . If you have some symptoms but not all symptoms, continue to monitor at home and seek medical attention if your symptoms worsen. . If you are having a medical emergency, call 911.  HOME CARE . Only take medications as instructed by your medical team. . Drink plenty of fluids and get plenty of rest. . A steam or ultrasonic humidifier can help if you have congestion.     GET HELP RIGHT AWAY IF YOU HAVE EMERGENCY WARNING SIGNS** FOR COVID-19. If you or someone is showing any of these signs seek emergency medical care immediately. Call 911 or proceed to your closest emergency facility if: . You develop worsening high fever. . Trouble breathing . Bluish lips or face . Persistent pain or pressure in the chest . New confusion . Inability to wake or stay awake . You cough up blood. . Your symptoms become more severe  **This list is not all possible symptoms. Contact your medical  provider for any symptoms that are sever or concerning to you.   MAKE SURE YOU   Understand these instructions.  Will watch your condition.  Will get help right away if you are not doing well or get worse.  Your e-visit answers were reviewed by a board certified advanced clinical practitioner to complete your personal care plan.  Depending on the condition, your plan could have included both over the counter or prescription medications.  If there is a problem please reply once you have received a response from your provider.  Your safety is important to us.  If you have drug allergies check your prescription carefully.    You can use MyChart to ask questions about today's visit, request a non-urgent call back, or ask for a work or school excuse for 24 hours related to this e-Visit. If it has been greater than 24 hours you will need to follow up with your provider, or enter a new e-Visit to address those concerns. You will get an e-mail in the next two days asking about your experience.  I hope that your e-visit has been valuable and will speed your recovery. Thank you for using e-visits.   Greater than 5 minutes, yet less than 10 minutes of time have been spent researching, coordinating and implementing care for this patient today.   

## 2019-01-24 DIAGNOSIS — R0981 Nasal congestion: Secondary | ICD-10-CM | POA: Diagnosis not present

## 2019-01-24 DIAGNOSIS — Z20828 Contact with and (suspected) exposure to other viral communicable diseases: Secondary | ICD-10-CM | POA: Diagnosis not present

## 2019-02-13 ENCOUNTER — Other Ambulatory Visit: Payer: Self-pay

## 2019-02-13 DIAGNOSIS — G43901 Migraine, unspecified, not intractable, with status migrainosus: Secondary | ICD-10-CM

## 2019-02-13 MED ORDER — SUMATRIPTAN SUCCINATE 50 MG PO TABS: ORAL_TABLET | ORAL | 0 refills | Status: DC

## 2019-03-16 DIAGNOSIS — Z20828 Contact with and (suspected) exposure to other viral communicable diseases: Secondary | ICD-10-CM | POA: Diagnosis not present

## 2019-03-16 DIAGNOSIS — J069 Acute upper respiratory infection, unspecified: Secondary | ICD-10-CM | POA: Diagnosis not present

## 2019-04-18 DIAGNOSIS — F330 Major depressive disorder, recurrent, mild: Secondary | ICD-10-CM | POA: Diagnosis not present

## 2019-04-18 DIAGNOSIS — G4700 Insomnia, unspecified: Secondary | ICD-10-CM | POA: Diagnosis not present

## 2019-04-18 DIAGNOSIS — F411 Generalized anxiety disorder: Secondary | ICD-10-CM | POA: Diagnosis not present

## 2019-04-19 ENCOUNTER — Other Ambulatory Visit: Payer: Self-pay

## 2019-04-19 DIAGNOSIS — G43901 Migraine, unspecified, not intractable, with status migrainosus: Secondary | ICD-10-CM

## 2019-04-20 ENCOUNTER — Other Ambulatory Visit: Payer: Self-pay

## 2019-04-20 DIAGNOSIS — G43901 Migraine, unspecified, not intractable, with status migrainosus: Secondary | ICD-10-CM

## 2019-04-20 NOTE — Telephone Encounter (Signed)
Last prescribed on 10/6/ 2020 . Last appointment on 04/27/2018. No future appointment

## 2019-04-20 NOTE — Telephone Encounter (Signed)
Patient needs appointment for further refills. Let me know when appointment has been made.

## 2019-04-24 MED ORDER — SUMATRIPTAN SUCCINATE 50 MG PO TABS: ORAL_TABLET | ORAL | 0 refills | Status: DC

## 2019-04-24 NOTE — Telephone Encounter (Signed)
Pt is scheduled for appt on 12/21.

## 2019-04-24 NOTE — Telephone Encounter (Signed)
Noted, refill sent to pharmacy. 

## 2019-04-30 ENCOUNTER — Telehealth: Payer: Self-pay | Admitting: Primary Care

## 2019-05-15 ENCOUNTER — Ambulatory Visit: Payer: BC Managed Care – PPO | Attending: Internal Medicine

## 2019-05-15 DIAGNOSIS — Z20822 Contact with and (suspected) exposure to covid-19: Secondary | ICD-10-CM

## 2019-05-16 ENCOUNTER — Telehealth (INDEPENDENT_AMBULATORY_CARE_PROVIDER_SITE_OTHER): Payer: BC Managed Care – PPO | Admitting: Primary Care

## 2019-05-16 ENCOUNTER — Other Ambulatory Visit: Payer: Self-pay

## 2019-05-16 DIAGNOSIS — E785 Hyperlipidemia, unspecified: Secondary | ICD-10-CM

## 2019-05-16 DIAGNOSIS — Z1231 Encounter for screening mammogram for malignant neoplasm of breast: Secondary | ICD-10-CM | POA: Diagnosis not present

## 2019-05-16 DIAGNOSIS — F418 Other specified anxiety disorders: Secondary | ICD-10-CM

## 2019-05-16 DIAGNOSIS — D509 Iron deficiency anemia, unspecified: Secondary | ICD-10-CM

## 2019-05-16 DIAGNOSIS — R5383 Other fatigue: Secondary | ICD-10-CM

## 2019-05-16 DIAGNOSIS — G43809 Other migraine, not intractable, without status migrainosus: Secondary | ICD-10-CM

## 2019-05-16 NOTE — Progress Notes (Signed)
Subjective:    Patient ID: Whitney [name], female    DOB: January 09, 1971, 49 y.o.   MRN: [id number]  HPI  Virtual Visit via Video Note  I connected with Whitney [name] on 05/16/19 at  7:20 AM EST by a video enabled telemedicine application and verified that I am speaking with the correct person using two identifiers.  Location: Patient: Home Provider: Office   I discussed the limitations of evaluation and management by telemedicine and the availability of in person appointments. The patient expressed understanding and agreed to proceed.  History of Present Illness:  Whitney [name] is a 49 year old female who presents today for follow up of chronic conditions and a chief complaint of fatigue. She is also due for her mammogram.  1) Depression/Anxiety: Currently following with psychiatry, managed on alprazolam 0.5 mg, duloxetine 60 mg, sertraline 100 mg. Overall feels well managed.   2) Migraines: Currently managed on sumatriptan 50 mg for which she takes as needed. She is experiencing migraines twice monthly around her menses, sumatriptan helps.  3) Hyperlipidemia: Currently not managed on medication. LDL of 132 in December 2019. She was not aware of these results. She is not exercising.   4) Fatigue: History of anemia. She's feeling "drained" all the time, low energy. She is sleeping well during the night, getting 7-8 hours nightly with melatonin. Denies snoring. She started a new job in Summer 2020, feels really tired when she comes home, thinks this may be contributing. She's not sure if she's vitamin deficient or if the fatigue is her age. She has an IUD. Tested for Covid-19 yesterday, results pending.    Observations/Objective:  Alert and oriented. Appears well, not sickly. No distress. Speaking in complete sentences.   Assessment and Plan:  See problem based charting.  Follow Up Instructions:  Call the main line to set up the lab appointment as discussed.  Start  exercising. You should be getting 150 minutes of moderate intensity exercise weekly.  Be sure to eat a healthy diet. Ensure you are consuming 64 ounces of water daily.  I'll be in touch soon! Mayra Reel, NP-C    I discussed the assessment and treatment plan with the patient. The patient was provided an opportunity to ask questions and all were answered. The patient agreed with the plan and demonstrated an understanding of the instructions.   The patient was advised to call back or seek an in-person evaluation if the symptoms worsen or if the condition fails to improve as anticipated.    Doreene Nest, NP    Review of Systems  Constitutional: Positive for fatigue. Negative for fever.  Respiratory: Negative for shortness of breath.   Cardiovascular: Negative for chest pain.  Genitourinary: Negative for menstrual problem.  Neurological:       Chronic migraines, no changes       Past Medical History:  Diagnosis Date  . Anxiety   . Depression   . Frequent headaches   . Hx of migraine headaches   . Migraines   . Urinary tract infection      Social History   Socioeconomic History  . Marital status: Married    Spouse name: Not on file  . Number of children: Not on file  . Years of education: Not on file  . Highest education level: Not on file  Occupational History  . Not on file  Tobacco Use  . Smoking status: Never Smoker  . Smokeless tobacco: Never Used  Substance and  Sexual Activity  . Alcohol use: No    Alcohol/week: 0.0 standard drinks  . Drug use: No  . Sexual activity: Yes    Partners: Male    Birth control/protection: Surgical, I.U.D.    Comment: Husband had vasectomy   Other Topics Concern  . 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   Caffeine- 2 cups of tea.   Enjoys spending time with family.   Social Determinants of Health   Financial Resource Strain:     . Difficulty of Paying Living Expenses: Not on file  Food Insecurity:   . Worried About Charity fundraiser in the Last Year: Not on file  . Ran Out of Food in the Last Year: Not on file  Transportation Needs:   . Lack of Transportation (Medical): Not on file  . Lack of Transportation (Non-Medical): Not on file  Physical Activity:   . Days of Exercise per Week: Not on file  . Minutes of Exercise per Session: Not on file  Stress:   . Feeling of Stress : Not on file  Social Connections:   . Frequency of Communication with Friends and Family: Not on file  . Frequency of Social Gatherings with Friends and Family: Not on file  . Attends Religious Services: Not on file  . Active Member of Clubs or Organizations: Not on file  . Attends Archivist Meetings: Not on file  . Marital Status: Not on file  Intimate Partner Violence:   . Fear of Current or Ex-Partner: Not on file  . Emotionally Abused: Not on file  . Physically Abused: Not on file  . Sexually Abused: Not on file    Past Surgical History:  Procedure Laterality Date  . BREAST BIOPSY Left 03/04/2015   FIBROCYSTIC CHANGES stereo    Family History  Problem Relation Age of Onset  . Breast cancer Mother 101  . Diabetes Father   . Breast cancer Maternal Aunt 61       Lung Cancer    No Known Allergies  Current Outpatient Medications on File Prior to Visit  Medication Sig Dispense Refill  . ALPRAZolam (XANAX) 0.5 MG tablet Take 0.5 mg by mouth as needed.     . benzonatate (TESSALON) 100 MG capsule Take 1-2 capsules (100-200 mg total) by mouth 3 (three) times daily as needed for cough. 40 capsule 0  . cephALEXin (KEFLEX) 500 MG capsule Take 1 capsule (500 mg total) by mouth 2 (two) times daily. 14 capsule 0  . DULoxetine (CYMBALTA) 60 MG capsule Take 60 mg by mouth daily.    Marland Kitchen levonorgestrel (MIRENA) 20 MCG/24HR IUD 1 each by Intrauterine route once.    Marland Kitchen MELATONIN PO Take 1 tablet by mouth at bedtime.    .  sertraline (ZOLOFT) 100 MG tablet Take 200 mg by mouth daily.    . SUMAtriptan (IMITREX) 50 MG tablet May repeat in 2 hours if headache persists or recurs. 10 tablet 0   No current facility-administered medications on file prior to visit.    There were no vitals taken for this visit.   Objective:   Physical Exam  Constitutional: She is oriented to person, place, and time. She appears well-nourished.  Respiratory: Effort normal.  Neurological: She is alert and oriented to person, place, and time.  Psychiatric: She has a normal mood and affect.  Assessment & Plan:

## 2019-05-16 NOTE — Assessment & Plan Note (Signed)
Noted on labs from 2019, repeat lipids pending. Discussed the importance of a healthy diet and regular exercise in order for weight loss, and to reduce the risk of any potential medical problems.

## 2019-05-16 NOTE — Assessment & Plan Note (Signed)
CBC pending

## 2019-05-16 NOTE — Patient Instructions (Signed)
Call the main line to set up the lab appointment as discussed.  Start exercising. You should be getting 150 minutes of moderate intensity exercise weekly.  Be sure to eat a healthy diet. Ensure you are consuming 64 ounces of water daily.  I'll be in touch soon! Mayra Reel, NP-C

## 2019-05-16 NOTE — Assessment & Plan Note (Signed)
Occurring twice monthly on average, no changes. Doing well on PRN sumatriptan, continue same.

## 2019-05-16 NOTE — Assessment & Plan Note (Signed)
Following with psychiatry, doing well on current regimen. Continue same. 

## 2019-05-16 NOTE — Assessment & Plan Note (Signed)
Chronic for 6-7 months, could be secondary to work demands. Also not exercising.  Check labs including TSH, vitamin B12, vitamin D, CBC. She doesn't fit the clinical picture for sleep apnea.   Await results.

## 2019-05-18 LAB — NOVEL CORONAVIRUS, NAA: SARS-CoV-2, NAA: NOT DETECTED

## 2019-05-28 ENCOUNTER — Other Ambulatory Visit (INDEPENDENT_AMBULATORY_CARE_PROVIDER_SITE_OTHER): Payer: BC Managed Care – PPO

## 2019-05-28 ENCOUNTER — Other Ambulatory Visit: Payer: Self-pay

## 2019-05-28 DIAGNOSIS — E785 Hyperlipidemia, unspecified: Secondary | ICD-10-CM

## 2019-05-28 DIAGNOSIS — R5383 Other fatigue: Secondary | ICD-10-CM

## 2019-05-28 DIAGNOSIS — D509 Iron deficiency anemia, unspecified: Secondary | ICD-10-CM | POA: Diagnosis not present

## 2019-05-28 LAB — LIPID PANEL
Cholesterol: 230 mg/dL — ABNORMAL HIGH (ref 0–200)
HDL: 45.4 mg/dL (ref >39.00)
LDL Cholesterol: 163 mg/dL — ABNORMAL HIGH (ref 0–99)
NonHDL: 184.44
Total CHOL/HDL Ratio: 5
Triglycerides: 108 mg/dL (ref 0.0–149.0)
VLDL: 21.6 mg/dL (ref 0.0–40.0)

## 2019-05-28 LAB — CBC
HCT: 43.3 % (ref 36.0–46.0)
Hemoglobin: 14.5 g/dL (ref 12.0–15.0)
MCHC: 33.6 g/dL (ref 30.0–36.0)
MCV: 93.4 fl (ref 78.0–100.0)
Platelets: 194 10*3/uL (ref 150.0–400.0)
RBC: 4.63 Mil/uL (ref 3.87–5.11)
RDW: 13.4 % (ref 11.5–15.5)
WBC: 6.5 10*3/uL (ref 4.0–10.5)

## 2019-05-28 LAB — COMPREHENSIVE METABOLIC PANEL
ALT: 13 U/L (ref 0–35)
AST: 13 U/L (ref 0–37)
Albumin: 4.1 g/dL (ref 3.5–5.2)
Alkaline Phosphatase: 56 U/L (ref 39–117)
BUN: 17 mg/dL (ref 6–23)
CO2: 27 mEq/L (ref 19–32)
Calcium: 9.1 mg/dL (ref 8.4–10.5)
Chloride: 106 mEq/L (ref 96–112)
Creatinine, Ser: 0.79 mg/dL (ref 0.40–1.20)
GFR: 77.81 mL/min (ref >60.00)
Glucose, Bld: 113 mg/dL — ABNORMAL HIGH (ref 70–99)
Potassium: 4.2 mEq/L (ref 3.5–5.1)
Sodium: 140 mEq/L (ref 135–145)
Total Bilirubin: 0.6 mg/dL (ref 0.2–1.2)
Total Protein: 6.5 g/dL (ref 6.0–8.3)

## 2019-05-28 LAB — TSH: TSH: 1.62 u[IU]/mL (ref 0.35–4.50)

## 2019-05-28 LAB — VITAMIN B12: Vitamin B-12: 216 pg/mL (ref 211–911)

## 2019-05-28 LAB — VITAMIN D 25 HYDROXY (VIT D DEFICIENCY, FRACTURES): VITD: 21.03 ng/mL — ABNORMAL LOW (ref 30.00–100.00)

## 2019-05-31 ENCOUNTER — Other Ambulatory Visit: Payer: Self-pay | Admitting: Primary Care

## 2019-05-31 DIAGNOSIS — R739 Hyperglycemia, unspecified: Secondary | ICD-10-CM

## 2019-06-04 ENCOUNTER — Other Ambulatory Visit (INDEPENDENT_AMBULATORY_CARE_PROVIDER_SITE_OTHER): Payer: BC Managed Care – PPO

## 2019-06-04 ENCOUNTER — Other Ambulatory Visit: Payer: Self-pay

## 2019-06-04 DIAGNOSIS — R739 Hyperglycemia, unspecified: Secondary | ICD-10-CM | POA: Diagnosis not present

## 2019-06-04 LAB — POCT GLYCOSYLATED HEMOGLOBIN (HGB A1C): Hemoglobin A1C: 5.2 % (ref 4.0–5.6)

## 2019-06-19 ENCOUNTER — Ambulatory Visit
Admission: RE | Admit: 2019-06-19 | Discharge: 2019-06-19 | Disposition: A | Discharge status: Home / Self Care | Payer: BC Managed Care – PPO | Source: Ambulatory Visit | Attending: Primary Care | Admitting: Primary Care

## 2019-06-19 DIAGNOSIS — Z1231 Encounter for screening mammogram for malignant neoplasm of breast: Secondary | ICD-10-CM | POA: Diagnosis not present

## 2019-07-11 ENCOUNTER — Other Ambulatory Visit: Payer: Self-pay | Admitting: Primary Care

## 2019-07-11 DIAGNOSIS — G43901 Migraine, unspecified, not intractable, with status migrainosus: Secondary | ICD-10-CM

## 2019-08-14 DIAGNOSIS — F330 Major depressive disorder, recurrent, mild: Secondary | ICD-10-CM | POA: Diagnosis not present

## 2019-08-14 DIAGNOSIS — G4700 Insomnia, unspecified: Secondary | ICD-10-CM | POA: Diagnosis not present

## 2019-08-14 DIAGNOSIS — F411 Generalized anxiety disorder: Secondary | ICD-10-CM | POA: Diagnosis not present

## 2019-08-18 ENCOUNTER — Ambulatory Visit: Payer: BC Managed Care – PPO | Attending: Internal Medicine

## 2019-08-18 ENCOUNTER — Other Ambulatory Visit: Payer: Self-pay

## 2019-08-18 DIAGNOSIS — Z23 Encounter for immunization: Secondary | ICD-10-CM

## 2019-08-18 NOTE — Progress Notes (Signed)
   Covid-19 Vaccination Clinic  Name:  Whitney [name]    MRN: [id number] DOB: 1970-08-29  08/18/2019  Whitney [name] was observed post Covid-19 immunization for 15 minutes without incident. She was provided with Vaccine Information Sheet and instruction to access the V-Safe system.   Whitney [name] was instructed to call 911 with any severe reactions post vaccine: Marland Kitchen Difficulty breathing  . Swelling of face and throat  . A fast heartbeat  . A bad rash all over body  . Dizziness and weakness   Immunizations Administered    Name Date Dose VIS Date Route   Pfizer COVID-19 Vaccine 08/18/2019  9:27 AM 0.3 mL 04/20/2019 Intramuscular   Manufacturer: [employer], [employer]   Lot: [id number]   NDC: [address]-8

## 2019-09-11 ENCOUNTER — Ambulatory Visit: Payer: BC Managed Care – PPO | Attending: Internal Medicine

## 2019-09-11 DIAGNOSIS — Z23 Encounter for immunization: Secondary | ICD-10-CM

## 2019-09-11 NOTE — Progress Notes (Signed)
   Covid-19 Vaccination Clinic  Name:  Whitney [name]    MRN: [id number] DOB: [birth year]  09/11/2019  [name] was observed post Covid-19 immunization for 15 minutes without incident. She was provided with Vaccine Information Sheet and instruction to access the V-Safe system.   [name] was instructed to call 911 with any severe reactions post vaccine: Marland Kitchen Difficulty breathing  . Swelling of face and throat  . A fast heartbeat  . A bad rash all over body  . Dizziness and weakness   Immunizations Administered    Name Date Dose VIS Date Route   Pfizer COVID-19 Vaccine 09/11/2019  4:25 PM 0.3 mL 07/04/2018 Intramuscular   Manufacturer: [employer], [employer]   Lot: [id number]   NDC: [address]-1

## 2019-09-13 ENCOUNTER — Telehealth: Payer: Self-pay

## 2019-09-13 NOTE — Telephone Encounter (Signed)
Had 2nd covid shot on Tuesday  Started bleeding heavy last night. Mirena placed. Changing q 30 minutes - until 11:00pm. Got up at night to change x 2.   Today not has heavy. Changed x 2 since 7am.  No fever. NO abd pain or cramps.   Pls advise. Thanks.

## 2019-09-13 NOTE — Telephone Encounter (Signed)
Pt aware. Appt made for 09/17/19.

## 2019-09-13 NOTE — Telephone Encounter (Signed)
Well, it doesn't look like I've not seen her since 2019.  May have been related to the COVID shot, but she should probably have an ultrasound to make sure her IUD hasn't shifted or is out of place. It seems like her bleeding is slowing down so may just have been coincidental.  Dr. Valentino Saxon

## 2019-09-17 ENCOUNTER — Ambulatory Visit (INDEPENDENT_AMBULATORY_CARE_PROVIDER_SITE_OTHER): Payer: BC Managed Care – PPO

## 2019-09-17 ENCOUNTER — Other Ambulatory Visit: Payer: Self-pay

## 2019-09-17 ENCOUNTER — Other Ambulatory Visit: Payer: Self-pay | Admitting: Obstetrics and Gynecology

## 2019-09-17 DIAGNOSIS — N939 Abnormal uterine and vaginal bleeding, unspecified: Secondary | ICD-10-CM | POA: Diagnosis not present

## 2019-09-21 ENCOUNTER — Telehealth: Payer: Self-pay | Admitting: Obstetrics and Gynecology

## 2019-09-21 NOTE — Telephone Encounter (Signed)
Patient states she came in for a u/s and has not received any results. Can you please advice .

## 2019-09-27 ENCOUNTER — Other Ambulatory Visit: Payer: Self-pay | Admitting: Primary Care

## 2019-09-27 DIAGNOSIS — G43901 Migraine, unspecified, not intractable, with status migrainosus: Secondary | ICD-10-CM

## 2019-09-28 NOTE — Telephone Encounter (Signed)
Last prescribed on 07/12/2019. Last OV on 05/16/2019 (video visit). No future OV scheduled

## 2019-09-28 NOTE — Telephone Encounter (Signed)
Pt called no answer and no vm picked up. Will send pt a mychart message.

## 2019-09-28 NOTE — Telephone Encounter (Signed)
Too soon to fill. See my chart message.

## 2019-12-11 DIAGNOSIS — F411 Generalized anxiety disorder: Secondary | ICD-10-CM | POA: Diagnosis not present

## 2019-12-11 DIAGNOSIS — G4700 Insomnia, unspecified: Secondary | ICD-10-CM | POA: Diagnosis not present

## 2019-12-11 DIAGNOSIS — F330 Major depressive disorder, recurrent, mild: Secondary | ICD-10-CM | POA: Diagnosis not present

## 2019-12-27 ENCOUNTER — Telehealth (INDEPENDENT_AMBULATORY_CARE_PROVIDER_SITE_OTHER): Payer: BC Managed Care – PPO | Admitting: Primary Care

## 2019-12-27 ENCOUNTER — Other Ambulatory Visit: Payer: Self-pay

## 2019-12-27 DIAGNOSIS — R519 Headache, unspecified: Secondary | ICD-10-CM | POA: Diagnosis not present

## 2019-12-27 MED ORDER — TOPIRAMATE 50 MG PO TABS: 50.0000 mg | ORAL_TABLET | Freq: Every day | ORAL | 0 refills | Status: DC

## 2019-12-27 NOTE — Assessment & Plan Note (Signed)
Recurrent headaches for the last 2-3 months, could be weather related given the humidity, rain, and warm weather. Agree to trial Topamax as she doesn't remember any bad effects.  Rx for Topamax 50 mg sent to pharmacy, she will update in a few weeks. Continue PRN Imitrex.

## 2019-12-27 NOTE — Patient Instructions (Signed)
Start topiramate (Topamax) 50 mg at bedtime for migraine/headache prevention.  Please update me in a few weeks, if you like the medication then I'll send plenty more to the pharmacy.  You may take Tylenol and/or use Imitrex if needed.  It was a pleasure to see you today! Mayra Reel, NP-C

## 2019-12-27 NOTE — Progress Notes (Signed)
Subjective:    Patient ID: Whitney [name], female    DOB: 1970-06-30, 49 y.o.   MRN: [id number]  HPI  This visit occurred during the SARS-CoV-2 public health emergency.  Safety protocols were in place, including screening questions prior to the visit, additional usage of staff PPE, and extensive cleaning of exam room while observing appropriate contact time as indicated for disinfecting solutions.   Virtual Visit via Video Note  I connected with Whitney [name] on 12/27/19 at 11:20 AM EDT by a video enabled telemedicine application and verified that I am speaking with the correct person using two identifiers.  Location: Patient: Home Provider: Office Participants: Patient and myself   I discussed the limitations of evaluation and management by telemedicine and the availability of in person appointments. The patient expressed understanding and agreed to proceed.  History of Present Illness:  Whitney [name] is a 49 year old female with a history of migraines, anxiety with depression, iron deficiency anemia, fatigue who presents today with a chief complaint of headaches.   History of intermittent migraines and headaches for years, migraines typically occur once monthly with menstrual cycles, headaches occurred  once weekly on average.   Over the last 2-3 months she's had daily right sided temporal headaches with her typical monthly migraines. She's been taking BC Powder daily in the mornings for headache treatment and/or to prevent a headache. Smells can trigger headaches.   Her most recent headache began five days ago, difficulty concentrating and functioning, had to take several doses of Imitrex before she had resolve. She's not had a headache or BC Power in two days. She was treated years ago for headache prevention, took Topamax and doesn't remember bad effects. She is interested in resuming treatment as she doesn't want to cause harm with recurrent BC powder use.     Observations/Objective:  Alert and oriented. Appears well, not sickly. No distress. Speaking in complete sentences.   Assessment and Plan:  Recurrent headaches for the last 2-3 months, could be weather related given the humidity, rain, and warm weather. Agree to trial Topamax as she doesn't remember any bad effects.  Rx for Topamax 50 mg sent to pharmacy, she will update in a few weeks. Continue PRN Imitrex.  Follow Up Instructions:  Start topiramate (Topamax) 50 mg at bedtime for migraine/headache prevention.  Please update me in a few weeks, if you like the medication then I'll send plenty more to the pharmacy.  You may take Tylenol and/or use Imitrex if needed.  It was a pleasure to see you today! Whitney Reel, NP-C    I discussed the assessment and treatment plan with the patient. The patient was provided an opportunity to ask questions and all were answered. The patient agreed with the plan and demonstrated an understanding of the instructions.   The patient was advised to call back or seek an in-person evaluation if the symptoms worsen or if the condition fails to improve as anticipated.    Whitney Nest, NP    Review of Systems  Eyes: Negative for visual disturbance.  Respiratory: Negative for shortness of breath.   Gastrointestinal: Positive for nausea.  Neurological: Positive for headaches. Negative for dizziness and light-headedness.       Past Medical History:  Diagnosis Date  . Anxiety   . Depression   . Frequent headaches   . Hx of migraine headaches   . Migraines   . Urinary tract infection      Social History  Socioeconomic History  . Marital status: Married    Spouse name: Not on file  . Number of children: Not on file  . Years of education: Not on file  . Highest education level: Not on file  Occupational History  . Not on file  Tobacco Use  . Smoking status: Never Smoker  . Smokeless tobacco: Never Used  Vaping Use  . Vaping  Use: Never used  Substance and Sexual Activity  . Alcohol use: No    Alcohol/week: 0.0 standard drinks  . Drug use: No  . Sexual activity: Yes    Partners: Male    Birth control/protection: Surgical, I.U.D.    Comment: Husband had vasectomy   Other Topics Concern  . Not on file  Social History Narrative   Works at [employer] as a Psychologist, forensic   Lives with her husband and 2 children.   Caffeine- 2 cups of tea.   Enjoys spending time with family.   Social Determinants of Health   Financial Resource Strain:   . Difficulty of Paying Living Expenses: Not on file  Food Insecurity:   . Worried About Programme researcher, broadcasting/film/video in the Last Year: Not on file  . Ran Out of Food in the Last Year: Not on file  Transportation Needs:   . Lack of Transportation (Medical): Not on file  . Lack of Transportation (Non-Medical): Not on file  Physical Activity:   . Days of Exercise per Week: Not on file  . Minutes of Exercise per Session: Not on file  Stress:   . Feeling of Stress : Not on file  Social Connections:   . Frequency of Communication with Friends and Family: Not on file  . Frequency of Social Gatherings with Friends and Family: Not on file  . Attends Religious Services: Not on file  . Active Member of Clubs or Organizations: Not on file  . Attends Banker Meetings: Not on file  . Marital Status: Not on file  Intimate Partner Violence:   . Fear of Current or Ex-Partner: Not on file  . Emotionally Abused: Not on file  . Physically Abused: Not on file  . Sexually Abused: Not on file    Past Surgical History:  Procedure Laterality Date  . BREAST BIOPSY Left 03/04/2015   FIBROCYSTIC CHANGES stereo    Family History  Problem Relation Age of Onset  . Breast cancer Mother 59  . Diabetes Father   . Breast cancer Maternal Aunt 61       Lung Cancer    No Known Allergies  Current Outpatient Medications on File Prior to Visit  Medication Sig Dispense Refill  .  ALPRAZolam (XANAX) 0.5 MG tablet Take 0.5 mg by mouth as needed.     . DULoxetine (CYMBALTA) 60 MG capsule Take 60 mg by mouth daily.    Marland Kitchen levonorgestrel (MIRENA) 20 MCG/24HR IUD 1 each by Intrauterine route once.    Marland Kitchen MELATONIN PO Take 1 tablet by mouth at bedtime.    . sertraline (ZOLOFT) 100 MG tablet Take 200 mg by mouth daily.    . SUMAtriptan (IMITREX) 50 MG tablet TAKE 1 TABLET BY MOUTH MAY REPEAT IN 2 HOURS IF HEADACHE PERSISTS OR RECURS. 10 tablet 0   No current facility-administered medications on file prior to visit.    There were no vitals taken for this visit.   Objective:   Physical Exam Pulmonary:     Effort: Pulmonary effort is normal.  Neurological:  Mental Status: She is alert and oriented to person, place, and time.  Psychiatric:        Mood and Affect: Mood normal.            Assessment & Plan:

## 2020-01-07 DIAGNOSIS — G43901 Migraine, unspecified, not intractable, with status migrainosus: Secondary | ICD-10-CM

## 2020-01-08 MED ORDER — SUMATRIPTAN SUCCINATE 50 MG PO TABS: ORAL_TABLET | ORAL | 0 refills | Status: DC

## 2020-01-24 ENCOUNTER — Other Ambulatory Visit: Payer: Self-pay

## 2020-01-24 DIAGNOSIS — R519 Headache, unspecified: Secondary | ICD-10-CM

## 2020-01-25 ENCOUNTER — Other Ambulatory Visit: Payer: Self-pay

## 2020-01-25 MED ORDER — TOPIRAMATE 50 MG PO TABS: 50.0000 mg | ORAL_TABLET | Freq: Every day | ORAL | 0 refills | Status: DC

## 2020-01-29 ENCOUNTER — Encounter: Payer: Self-pay | Admitting: Obstetrics and Gynecology

## 2020-01-29 ENCOUNTER — Other Ambulatory Visit: Payer: Self-pay

## 2020-01-29 ENCOUNTER — Ambulatory Visit (INDEPENDENT_AMBULATORY_CARE_PROVIDER_SITE_OTHER): Payer: BC Managed Care – PPO | Admitting: Obstetrics and Gynecology

## 2020-01-29 VITALS — BP 120/83 | HR 94 | Ht 67.0 in | Wt 166.6 lb

## 2020-01-29 DIAGNOSIS — Z803 Family history of malignant neoplasm of breast: Secondary | ICD-10-CM

## 2020-01-29 DIAGNOSIS — N6310 Unspecified lump in the right breast, unspecified quadrant: Secondary | ICD-10-CM

## 2020-01-29 NOTE — Progress Notes (Signed)
Pt present today due to breast concerns. Pt noticed pain and lump in right breast on  Saturday about 3-4 days ago. Pt stated that the area was tender to the touch.

## 2020-01-29 NOTE — Progress Notes (Signed)
   GYNECOLOGY CLINIC PROGRESS NOTE  Subjective:     Whitney [name] is an 49 y.o. G76P2012 female who presents for evaluation of a breast mass. Change was noted 3-4 days ago, and has been stable since first identified. The mass does not change during menstrual cycle. Notes menstrual cycles are irregular (infrequent) with IUD in place.The mass is tender however patient notes that she has been checking on it regularly and may be aggravating it. Patient denies nipple discharge. Breast cancer risk factors include: mom with breast CA. Last mammogram was in February 2021 and was normal.   The following portions of the patient's history were reviewed and updated as appropriate: allergies, current medications, past family history, past medical history, past social history, past surgical history and problem list.  Review of Systems A comprehensive review of systems was negative.     Objective:    BP 120/83   Pulse 94   Ht 5\' 7"  (1.702 m)   Wt 166 lb 9.6 oz (75.6 kg)   BMI 26.09 kg/m  General appearance: alert and no distress Breasts: No nipple retraction or dimpling, No nipple discharge or bleeding, No axillary or supraclavicular adenopathy, negative findings: normal in size and symmetry, skin normal, positive findings: 2 cm nodule located on the right upper inner quadrant (in areola region, 0.5 cm from nipple at 10-11 o'clock)   Assessment:   Breast mass, likely benign (right) Family history of breast cancer in mother  Plan:   - Reassured the patient that the finding is most likely benign, however with patient's family history will proceed with further evaluation.Arranged for ultrasound. - Patient offered hereditary genetic screening due to family history (notes stomach, lung, and breast cancer on her mother's side).  States that she would like to think about this.  - To follow up if symptoms persist or worsen.    , MD [facility]

## 2020-01-29 NOTE — Patient Instructions (Signed)
Breast Cyst  A breast cyst is a sac in the breast that is filled with fluid. They are usually noncancerous (benign) and are common among women. Breast cysts are most often in the upper, outer portion of the breast. One or more cysts may develop. They form when fluid builds up inside the breast glands. There are several types of breast cysts. Some are too small to feel, but these can be seen with imaging tests such as an X-ray of the breast (mammogram) or ultrasound. Breast cysts do not increase your risk of breast cancer. They usually disappear after you no longer have a menstrual cycle (after menopause), unless you take artificial hormones (are on hormone therapy). What are the causes? This condition may be caused by:  Blockage of tubes (ducts) in the breast glands, which leads to fluid buildup. Duct blockage may result from: ? Fibrocystic breast changes. This is a common, benign condition that occurs when women go through hormonal changes during the menstrual cycle. This is a common cause of multiple breast cysts. ? Overgrowth of breast tissue or breast glands. ? Scar tissue in the breast from previous surgery.  Changes in certain female hormones (estrogen and progesterone). The exact cause of this condition is not known. What increases the risk? You may be more likely to develop breast cysts if you have not gone through menopause. What are the signs or symptoms? Symptoms of this condition include:  Feeling one or more smooth, round, soft lumps (like grapes) in the breast that are easily movable. The lump or lumps may get bigger and more painful before your menstrual period and get smaller after your menstrual period.  Breast discomfort or pain. How is this diagnosed? This condition may be diagnosed based on:  A physical exam. A cyst can be felt by your health care provider during this exam.  Imaging tests, such as mammogram or ultrasound. Fluid may be removed from the cyst with a  needle (fine-needle aspiration) and tested to make sure the cyst is not cancerous. How is this treated? Treatment may not be needed for this condition. Your health care provider may monitor the cyst to see if it goes away on its own. If the cyst is uncomfortable or gets bigger, or if you do not like how the cyst makes your breast look, you may need treatment. Treatment may include:  Hormone therapy.  Fine-needle aspiration to drain fluid from the cyst. There is a chance of the cyst coming back (recurring) after aspiration.  Surgery to remove the cyst. Follow these instructions at home: Self-exams   Do a breast self-exam every month, or as often as directed. A breast self-exam involves: ? Comparing your breasts in the mirror. ? Looking for visible changes in your skin or nipples. ? Feeling for lumps or changes.  Having many breast cysts may make it harder to feel for new lumps. Understand how your breasts normally look and feel, and write down any changes in your breasts. Tell your health care provider about any changes. Eating and drinking  Follow instructions from your health care provider about eating and drinking restrictions.  Drink enough fluid to keep your urine pale yellow.  Avoid caffeine.  Cut down on salt (sodium) in what you eat and drink, especially before your menstrual period. Too much sodium can cause fluid buildup, breast swelling, and discomfort. General instructions  See your health care provider regularly. ? Get a yearly physical exam. ? If you are 20-40 years old, get a   clinical breast exam every 1-3 years. After the age of 40 years, get this exam every year. ? Get mammograms as often as directed.  Take over-the-counter and prescription medicines only as told by your health care provider.  Wear a supportive bra, especially when exercising.  Keep all follow-up visits as told by your health care provider. This is important. Contact a health care provider  if:  You feel, or think you feel, a lump in your breast.  You notice that both breasts look or feel different than usual.  Your breast is still causing pain after your menstrual period is over.  You find new lumps or bumps that were not there before.  You feel lumps in your armpit. Get help right away if:  You have severe pain, tenderness, redness, or warmth in your breast.  You have fluid or blood leaking from your nipple.  Your breast lump becomes hard and painful.  You notice dimpling or wrinkling of the breast or nipple. Summary  A breast cyst is a sac in the breast that is filled with fluid.  Treatment may not be needed for this condition.  If the cyst is uncomfortable or gets bigger, or if you do not like how the cyst makes your breast look, you may need treatment. This information is not intended to replace advice given to you by your health care provider. Make sure you discuss any questions you have with your health care provider. Document Revised: 09/12/2018 Document Reviewed: 09/12/2018 Elsevier Patient Education  2020 Elsevier Inc.  

## 2020-01-31 ENCOUNTER — Other Ambulatory Visit: Payer: Self-pay | Admitting: Obstetrics and Gynecology

## 2020-01-31 DIAGNOSIS — N6310 Unspecified lump in the right breast, unspecified quadrant: Secondary | ICD-10-CM

## 2020-02-06 ENCOUNTER — Other Ambulatory Visit: Payer: Self-pay

## 2020-02-11 ENCOUNTER — Ambulatory Visit
Admission: RE | Admit: 2020-02-11 | Discharge: 2020-02-11 | Disposition: A | Discharge status: Home / Self Care | Payer: BC Managed Care – PPO | Source: Ambulatory Visit | Attending: Obstetrics and Gynecology | Admitting: Obstetrics and Gynecology

## 2020-02-11 ENCOUNTER — Other Ambulatory Visit: Payer: Self-pay

## 2020-02-11 DIAGNOSIS — N6310 Unspecified lump in the right breast, unspecified quadrant: Secondary | ICD-10-CM

## 2020-02-11 DIAGNOSIS — R922 Inconclusive mammogram: Secondary | ICD-10-CM | POA: Diagnosis not present

## 2020-02-11 DIAGNOSIS — N6011 Diffuse cystic mastopathy of right breast: Secondary | ICD-10-CM | POA: Diagnosis not present

## 2020-02-25 ENCOUNTER — Other Ambulatory Visit: Payer: Self-pay

## 2020-02-25 DIAGNOSIS — R519 Headache, unspecified: Secondary | ICD-10-CM

## 2020-02-25 MED ORDER — TOPIRAMATE 50 MG PO TABS: 50.0000 mg | ORAL_TABLET | Freq: Every day | ORAL | 1 refills | Status: DC

## 2020-03-30 ENCOUNTER — Other Ambulatory Visit: Payer: Self-pay

## 2020-03-30 DIAGNOSIS — G43901 Migraine, unspecified, not intractable, with status migrainosus: Secondary | ICD-10-CM

## 2020-04-01 NOTE — Telephone Encounter (Signed)
Called patient to schedule cpe. Unable to LVM to schedule.

## 2020-04-01 NOTE — Telephone Encounter (Signed)
Pharmacy requests refill on: Sumatriptan 50 mg   LAST REFILL: 01/07/2020 LAST OV: 04/27/2018 NEXT OV: Not Scheduled  PHARMACY: [facility] [phone] [place], [place]

## 2020-04-01 NOTE — Telephone Encounter (Signed)
See my chart message

## 2020-04-02 ENCOUNTER — Encounter: Payer: Self-pay | Admitting: Primary Care

## 2020-04-02 MED ORDER — SUMATRIPTAN SUCCINATE 50 MG PO TABS: ORAL_TABLET | ORAL | 0 refills | Status: DC

## 2020-04-02 NOTE — Telephone Encounter (Signed)
Called patient to schedule cpe. LVM to schedule.  °

## 2020-04-02 NOTE — Telephone Encounter (Signed)
Whitney [name], she uses My Chart.

## 2020-04-08 DIAGNOSIS — F411 Generalized anxiety disorder: Secondary | ICD-10-CM | POA: Diagnosis not present

## 2020-04-08 DIAGNOSIS — G4700 Insomnia, unspecified: Secondary | ICD-10-CM | POA: Diagnosis not present

## 2020-04-08 DIAGNOSIS — F330 Major depressive disorder, recurrent, mild: Secondary | ICD-10-CM | POA: Diagnosis not present

## 2020-04-17 ENCOUNTER — Telehealth: Payer: Self-pay

## 2020-04-17 NOTE — Telephone Encounter (Signed)
mychart message sent to patient

## 2020-05-19 ENCOUNTER — Encounter: Payer: BC Managed Care – PPO | Admitting: Primary Care

## 2020-06-24 ENCOUNTER — Ambulatory Visit (INDEPENDENT_AMBULATORY_CARE_PROVIDER_SITE_OTHER): Payer: BC Managed Care – PPO | Admitting: Primary Care

## 2020-06-24 ENCOUNTER — Encounter: Payer: Self-pay | Admitting: Primary Care

## 2020-06-24 ENCOUNTER — Other Ambulatory Visit: Payer: Self-pay

## 2020-06-24 VITALS — BP 118/62 | HR 88 | Temp 98.3°F | Ht 67.0 in | Wt 161.0 lb

## 2020-06-24 DIAGNOSIS — G43809 Other migraine, not intractable, without status migrainosus: Secondary | ICD-10-CM | POA: Diagnosis not present

## 2020-06-24 DIAGNOSIS — D509 Iron deficiency anemia, unspecified: Secondary | ICD-10-CM

## 2020-06-24 DIAGNOSIS — F418 Other specified anxiety disorders: Secondary | ICD-10-CM

## 2020-06-24 DIAGNOSIS — Z0000 Encounter for general adult medical examination without abnormal findings: Secondary | ICD-10-CM | POA: Diagnosis not present

## 2020-06-24 DIAGNOSIS — K5901 Slow transit constipation: Secondary | ICD-10-CM

## 2020-06-24 DIAGNOSIS — Z1159 Encounter for screening for other viral diseases: Secondary | ICD-10-CM | POA: Diagnosis not present

## 2020-06-24 DIAGNOSIS — E785 Hyperlipidemia, unspecified: Secondary | ICD-10-CM | POA: Diagnosis not present

## 2020-06-24 DIAGNOSIS — R519 Headache, unspecified: Secondary | ICD-10-CM

## 2020-06-24 NOTE — Assessment & Plan Note (Signed)
Chronic constipation, stable. No concerns today, continue to monitor.

## 2020-06-24 NOTE — Assessment & Plan Note (Signed)
Discussed the importance of a healthy diet and regular exercise in order for weight loss, and to reduce the risk of any potential medical problems.  Repeat lipid panel pending. 

## 2020-06-24 NOTE — Assessment & Plan Note (Signed)
Improved some with Topamax 50 mg, could gain better control with a trial of increased dose.  Increase Topamax to 75 mg x 1-2 weeks, then increase to 100 mg thereafter.  She will update in a few weeks, will send new Rx at that time.

## 2020-06-24 NOTE — Assessment & Plan Note (Signed)
Tetanus and Covid vaccines UTD, declines influenza vaccine. Pap smear UTD, follows with GYN. Mammogram UTD. Colonoscopy due, she declines today and will think about this.  Discussed the importance of a healthy diet and regular exercise in order for weight loss, and to reduce the risk of any potential medical problems.  Exam today stable. Labs pending.

## 2020-06-24 NOTE — Assessment & Plan Note (Signed)
Repeat CBC pending. 

## 2020-06-24 NOTE — Assessment & Plan Note (Signed)
Some benefit with Topamax 50 mg, but still with migraines occurring 1-3 times every other month or so. Will increase her Topamax to 75 mg x 1-2 weeks, then increase to 100 mg thereafter.  She will call with an update in a few weeks, then will send in Rx for 100 mg of Topamax.  Continue with PRN sumatriptan 50 mg.

## 2020-06-24 NOTE — Progress Notes (Signed)
Subjective:    Patient ID: Whitney [name], female    DOB: 1970/11/05, 50 y.o.   MRN: [id number]  HPI  This visit occurred during the SARS-CoV-2 public health emergency.  Safety protocols were in place, including screening questions prior to the visit, additional usage of staff PPE, and extensive cleaning of exam room while observing appropriate contact time as indicated for disinfecting solutions.   Whitney [name] is a 50 year old female who presents today for complete physical.  She continues to notice migraines during weather changes, around her menstrual cycle, changes in sleeping pattern. Migraines occur 1-3 times every 2-3 months. Headaches have improved overall and are less frequent. She is compliant to Topamax 50 mg daily.  Immunizations: -Tetanus: 2016 -Influenza: Declines  -Covid-19: Completed 2 vaccines  Diet: She endorses a healthy diet.  Exercise: No regular exercise   Eye exam: Due, she plans on scheduling  Dental exam: Completes regularly   Pap Smear: Completed in December 2019, follows with GYN Mammogram: Completed in October 2021 Colonoscopy: Never completed, declines Hep C Screen: Due  BP Readings from Last 3 Encounters:  06/24/20 118/62  01/29/20 120/83  04/27/18 116/72     Review of Systems  Constitutional: Negative for unexpected weight change.  HENT: Negative for rhinorrhea.   Eyes:       Dry eyes  Respiratory: Negative for cough and shortness of breath.   Cardiovascular: Negative for chest pain.  Gastrointestinal: Positive for constipation. Negative for diarrhea.       Chronic constipation   Genitourinary: Negative for difficulty urinating and menstrual problem.  Musculoskeletal: Negative for arthralgias and myalgias.  Skin: Negative for rash.  Allergic/Immunologic: Negative for environmental allergies.  Neurological: Positive for headaches. Negative for dizziness.  Psychiatric/Behavioral: The patient is not nervous/anxious.        Past  Medical History:  Diagnosis Date  . Anxiety   . Depression   . Frequent headaches   . Hx of migraine headaches   . Migraines   . Urinary tract infection      Social History   Socioeconomic History  . Marital status: Married    Spouse name: Not on file  . Number of children: Not on file  . Years of education: Not on file  . Highest education level: Not on file  Occupational History  . Not on file  Tobacco Use  . Smoking status: Never Smoker  . Smokeless tobacco: Never Used  Vaping Use  . Vaping Use: Never used  Substance and Sexual Activity  . Alcohol use: No    Alcohol/week: 0.0 standard drinks  . Drug use: No  . Sexual activity: Yes    Partners: Male    Birth control/protection: Surgical, I.U.D.    Comment: Husband had vasectomy   Other Topics Concern  . Not on file  Social History Narrative   Works at [employer] as a Psychologist, forensic   Lives with her husband and 2 children.   Caffeine- 2 cups of tea.   Enjoys spending time with family.   Social Determinants of Health   Financial Resource Strain: Not on file  Food Insecurity: Not on file  Transportation Needs: Not on file  Physical Activity: Not on file  Stress: Not on file  Social Connections: Not on file  Intimate Partner Violence: Not on file    Past Surgical History:  Procedure Laterality Date  . BREAST BIOPSY Left 03/04/2015   FIBROCYSTIC CHANGES stereo    Family History  Problem Relation Age of Onset  . Breast cancer Mother 36  . Diabetes Father   . Breast cancer Maternal Aunt 61       Lung Cancer  . Stomach cancer Maternal Grandmother     No Known Allergies  Current Outpatient Medications on File Prior to Visit  Medication Sig Dispense Refill  . ALPRAZolam (XANAX) 0.5 MG tablet Take 0.5 mg by mouth as needed.     . DULoxetine (CYMBALTA) 60 MG capsule Take 60 mg by mouth daily.    Marland Kitchen levonorgestrel (MIRENA) 20 MCG/24HR IUD 1 each by Intrauterine route once.    Marland Kitchen MELATONIN PO Take 1  tablet by mouth at bedtime.    . sertraline (ZOLOFT) 100 MG tablet Take 200 mg by mouth daily.    . SUMAtriptan (IMITREX) 50 MG tablet Take 1 tablet by mouth at migraine onset. May repeat in 2 hours if headache persists or recurs. 10 tablet 0  . topiramate (TOPAMAX) 50 MG tablet Take 1 tablet (50 mg total) by mouth at bedtime. For headache prevention. 90 tablet 1   No current facility-administered medications on file prior to visit.    BP 118/62   Pulse 88   Temp 98.3 F (36.8 C) (Temporal)   Ht 5\' 7"  (1.702 m)   Wt 161 lb (73 kg)   SpO2 97%   BMI 25.22 kg/m    Objective:   Physical Exam Constitutional:      Appearance: She is well-nourished.  HENT:     Right Ear: Tympanic membrane and ear canal normal.     Left Ear: Tympanic membrane and ear canal normal.     Mouth/Throat:     Mouth: Oropharynx is clear and moist.  Eyes:     Extraocular Movements: EOM normal.     Pupils: Pupils are equal, round, and reactive to light.  Cardiovascular:     Rate and Rhythm: Normal rate and regular rhythm.  Pulmonary:     Effort: Pulmonary effort is normal.     Breath sounds: Normal breath sounds.  Abdominal:     General: Bowel sounds are normal.     Palpations: Abdomen is soft.     Tenderness: There is no abdominal tenderness.  Musculoskeletal:        General: Normal range of motion.     Cervical back: Neck supple.  Skin:    General: Skin is warm and dry.  Neurological:     Mental Status: She is alert and oriented to person, place, and time.     Cranial Nerves: No cranial nerve deficit.     Deep Tendon Reflexes:     Reflex Scores:      Patellar reflexes are 2+ on the right side and 2+ on the left side. Psychiatric:        Mood and Affect: Mood and affect and mood normal.            Assessment & Plan:

## 2020-06-24 NOTE — Assessment & Plan Note (Signed)
Doing well on current regimen of Cymbalta 60 mg, Zoloft 200 mg, and PRN Xanax 0.5 mg. Follows with psychiatry. Continue current regimen.

## 2020-06-24 NOTE — Patient Instructions (Signed)
Stop by the lab prior to leaving today. I will notify you of your results once received.   Start exercising. You should be getting 150 minutes of moderate intensity exercise weekly.  Continue to work on a healthy diet. Ensure you are consuming 64 ounces of water daily.  Think about the colonoscopy.  Increase your dose of Topamax to 75 mg (1 and 1/2 tab) once daily for about one week, then increase to 100 mg (2 tabs).   Please update me regarding your headaches as discussed.  It was a pleasure to see you today!   Preventive Care 40-64 Years Old, Female Preventive care refers to lifestyle choices and visits with your health care provider that can promote health and wellness. This includes:  A yearly physical exam. This is also called an annual wellness visit.  Regular dental and eye exams.  Immunizations.  Screening for certain conditions.  Healthy lifestyle choices, such as: ? Eating a healthy diet. ? Getting regular exercise. ? Not using drugs or products that contain nicotine and tobacco. ? Limiting alcohol use. What can I expect for my preventive care visit? Physical exam Your health care provider will check your:  Height and weight. These may be used to calculate your BMI (body mass index). BMI is a measurement that tells if you are at a healthy weight.  Heart rate and blood pressure.  Body temperature.  Skin for abnormal spots. Counseling Your health care provider may ask you questions about your:  Past medical problems.  Family's medical history.  Alcohol, tobacco, and drug use.  Emotional well-being.  Home life and relationship well-being.  Sexual activity.  Diet, exercise, and sleep habits.  Work and work environment.  Access to firearms.  Method of birth control.  Menstrual cycle.  Pregnancy history. What immunizations do I need? Vaccines are usually given at various ages, according to a schedule. Your health care provider will recommend  vaccines for you based on your age, medical history, and lifestyle or other factors, such as travel or where you work.   What tests do I need? Blood tests  Lipid and cholesterol levels. These may be checked every 5 years, or more often if you are over 50 years old.  Hepatitis C test.  Hepatitis B test. Screening  Lung cancer screening. You may have this screening every year starting at age 55 if you have a 30-pack-year history of smoking and currently smoke or have quit within the past 15 years.  Colorectal cancer screening. ? All adults should have this screening starting at age 50 and continuing until age 75. ? Your health care provider may recommend screening at age 45 if you are at increased risk. ? You will have tests every 1-10 years, depending on your results and the type of screening test.  Diabetes screening. ? This is done by checking your blood sugar (glucose) after you have not eaten for a while (fasting). ? You may have this done every 1-3 years.  Mammogram. ? This may be done every 1-2 years. ? Talk with your health care provider about when you should start having regular mammograms. This may depend on whether you have a family history of breast cancer.  BRCA-related cancer screening. This may be done if you have a family history of breast, ovarian, tubal, or peritoneal cancers.  Pelvic exam and Pap test. ? This may be done every 3 years starting at age 21. ? Starting at age 30, this may be done every 5 years   if you have a Pap test in combination with an HPV test. Other tests  STD (sexually transmitted disease) testing, if you are at risk.  Bone density scan. This is done to screen for osteoporosis. You may have this scan if you are at high risk for osteoporosis. Talk with your health care provider about your test results, treatment options, and if necessary, the need for more tests. Follow these instructions at home: Eating and drinking  Eat a diet that includes  fresh fruits and vegetables, whole grains, lean protein, and low-fat dairy products.  Take vitamin and mineral supplements as recommended by your health care provider.  Do not drink alcohol if: ? Your health care provider tells you not to drink. ? You are pregnant, may be pregnant, or are planning to become pregnant.  If you drink alcohol: ? Limit how much you have to 0-1 drink a day. ? Be aware of how much alcohol is in your drink. In the [place] one drink equals one 12 oz bottle of beer (355 mL), one 5 oz glass of wine (148 mL), or one 1 oz glass of hard liquor (44 mL).   Lifestyle  Take daily care of your teeth and gums. Brush your teeth every morning and night with fluoride toothpaste. Floss one time each day.  Stay active. Exercise for at least 30 minutes 5 or more days each week.  Do not use any products that contain nicotine or tobacco, such as cigarettes, e-cigarettes, and chewing tobacco. If you need help quitting, ask your health care provider.  Do not use drugs.  If you are sexually active, practice safe sex. Use a condom or other form of protection to prevent STIs (sexually transmitted infections).  If you do not wish to become pregnant, use a form of birth control. If you plan to become pregnant, see your health care provider for a prepregnancy visit.  If told by your health care provider, take low-dose aspirin daily starting at age 50.  Find healthy ways to cope with stress, such as: ? Meditation, yoga, or listening to music. ? Journaling. ? Talking to a trusted person. ? Spending time with friends and family. Safety  Always wear your seat belt while driving or riding in a vehicle.  Do not drive: ? If you have been drinking alcohol. Do not ride with someone who has been drinking. ? When you are tired or distracted. ? While texting.  Wear a helmet and other protective equipment during sports activities.  If you have firearms in your house, make sure you follow  all gun safety procedures. What's next?  Visit your health care provider once a year for an annual wellness visit.  Ask your health care provider how often you should have your eyes and teeth checked.  Stay up to date on all vaccines. This information is not intended to replace advice given to you by your health care provider. Make sure you discuss any questions you have with your health care provider. Document Revised: 01/29/2020 Document Reviewed: 01/05/2018 Elsevier Patient Education  2021 Elsevier Inc.  

## 2020-06-25 LAB — LIPID PANEL
Cholesterol: 224 mg/dL — ABNORMAL HIGH (ref 0–200)
HDL: 42.6 mg/dL (ref >39.00)
NonHDL: 181.49
Total CHOL/HDL Ratio: 5
Triglycerides: 274 mg/dL — ABNORMAL HIGH (ref 0.0–149.0)
VLDL: 54.8 mg/dL — ABNORMAL HIGH (ref 0.0–40.0)

## 2020-06-25 LAB — CBC
HCT: 41.6 % (ref 36.0–46.0)
Hemoglobin: 14.2 g/dL (ref 12.0–15.0)
MCHC: 34.1 g/dL (ref 30.0–36.0)
MCV: 92.4 fl (ref 78.0–100.0)
Platelets: 232 10*3/uL (ref 150.0–400.0)
RBC: 4.5 Mil/uL (ref 3.87–5.11)
RDW: 13.3 % (ref 11.5–15.5)
WBC: 6.1 10*3/uL (ref 4.0–10.5)

## 2020-06-25 LAB — COMPREHENSIVE METABOLIC PANEL
ALT: 13 U/L (ref 0–35)
AST: 14 U/L (ref 0–37)
Albumin: 4.2 g/dL (ref 3.5–5.2)
Alkaline Phosphatase: 56 U/L (ref 39–117)
BUN: 19 mg/dL (ref 6–23)
CO2: 27 mEq/L (ref 19–32)
Calcium: 9.1 mg/dL (ref 8.4–10.5)
Chloride: 107 mEq/L (ref 96–112)
Creatinine, Ser: 0.88 mg/dL (ref 0.40–1.20)
GFR: 77.58 mL/min (ref >60.00)
Glucose, Bld: 78 mg/dL (ref 70–99)
Potassium: 4.3 mEq/L (ref 3.5–5.1)
Sodium: 140 mEq/L (ref 135–145)
Total Bilirubin: 0.3 mg/dL (ref 0.2–1.2)
Total Protein: 6.8 g/dL (ref 6.0–8.3)

## 2020-06-25 LAB — LDL CHOLESTEROL, DIRECT: Direct LDL: 157 mg/dL

## 2020-06-25 LAB — HEPATITIS C ANTIBODY
Hepatitis C Ab: NONREACTIVE
SIGNAL TO CUT-OFF: 0.01 (ref <1.00)

## 2020-06-25 LAB — HEMOGLOBIN A1C: Hgb A1c MFr Bld: 5.3 % (ref 4.6–6.5)

## 2020-07-01 DIAGNOSIS — F411 Generalized anxiety disorder: Secondary | ICD-10-CM | POA: Diagnosis not present

## 2020-07-01 DIAGNOSIS — F330 Major depressive disorder, recurrent, mild: Secondary | ICD-10-CM | POA: Diagnosis not present

## 2020-07-01 DIAGNOSIS — G4700 Insomnia, unspecified: Secondary | ICD-10-CM | POA: Diagnosis not present

## 2020-07-10 ENCOUNTER — Other Ambulatory Visit: Payer: Self-pay | Admitting: Primary Care

## 2020-07-10 DIAGNOSIS — G43901 Migraine, unspecified, not intractable, with status migrainosus: Secondary | ICD-10-CM

## 2020-07-18 ENCOUNTER — Ambulatory Visit (INDEPENDENT_AMBULATORY_CARE_PROVIDER_SITE_OTHER): Payer: BC Managed Care – PPO | Admitting: Certified Nurse Midwife

## 2020-07-18 ENCOUNTER — Telehealth: Payer: Self-pay | Admitting: Certified Nurse Midwife

## 2020-07-18 ENCOUNTER — Encounter: Payer: Self-pay | Admitting: Certified Nurse Midwife

## 2020-07-18 ENCOUNTER — Other Ambulatory Visit: Payer: Self-pay

## 2020-07-18 ENCOUNTER — Other Ambulatory Visit: Payer: Self-pay | Admitting: Certified Nurse Midwife

## 2020-07-18 VITALS — BP 112/78 | HR 99 | Ht 67.0 in | Wt 164.1 lb

## 2020-07-18 DIAGNOSIS — N6325 Unspecified lump in the left breast, overlapping quadrants: Secondary | ICD-10-CM | POA: Diagnosis not present

## 2020-07-18 DIAGNOSIS — Z1231 Encounter for screening mammogram for malignant neoplasm of breast: Secondary | ICD-10-CM

## 2020-07-18 DIAGNOSIS — Z803 Family history of malignant neoplasm of breast: Secondary | ICD-10-CM

## 2020-07-18 NOTE — Progress Notes (Signed)
GYN ENCOUNTER NOTE  Subjective:       Whitney [name] is a 50 y.o. [phone] female patient of Dr. Hildred Laser here for evaluation of left breast lump.   Reports history of breast changes and cyst with menstrual cycle.   Last seen in office 01/29/2020 for similar concerns in right breast; for further details, please see previous note.  Notes opportunity for dietary improvements.   Denies difficulty breathing or respiratory distress, chest pain, abdominal pain, excessive vaginal bleeding, dysuria, and leg pain.   Routine mammogram scheduled for 10/2020. Family history significant for breast cancer in mother and aunt.    Gynecologic History  No LMP recorded. (Menstrual status: IUD).  Contraception: IUD, Mirena  Last Pap: 04/2018. Results were: Neg/Neg  Last mammogram: 02/2020. Results were: BI-RADS 1  Obstetric History  OB History  Gravida Para Term Preterm AB Living  3 2 2   1 2   SAB IAB Ectopic Multiple Live Births    1          # Outcome Date GA Lbr Len/2nd Weight Sex Delivery Anes PTL Lv  3 IAB           2 Term           1 Term             Past Medical History:  Diagnosis Date   Anxiety    Depression    Frequent headaches    Hx of migraine headaches    Migraines    Urinary tract infection     Past Surgical History:  Procedure Laterality Date   BREAST BIOPSY Left 03/04/2015   FIBROCYSTIC CHANGES stereo    Current Outpatient Medications on File Prior to Visit  Medication Sig Dispense Refill   ALPRAZolam (XANAX) 0.5 MG tablet Take 0.5 mg by mouth as needed.      DULoxetine (CYMBALTA) 60 MG capsule Take 60 mg by mouth daily.     levonorgestrel (MIRENA) 20 MCG/24HR IUD 1 each by Intrauterine route once.     MELATONIN PO Take 1 tablet by mouth at bedtime.     sertraline (ZOLOFT) 100 MG tablet Take 200 mg by mouth daily.     SUMAtriptan (IMITREX) 50 MG tablet TAKE 1 TABLET BY MOUTH AT MIGRAINE ONSET. MAY REPEAT IN 2 HOURS IF HEADACHE SYMPTOMS  PERSISTS OR RECURS 10 tablet 0   topiramate (TOPAMAX) 50 MG tablet Take 1 tablet (50 mg total) by mouth at bedtime. For headache prevention. 90 tablet 1   No current facility-administered medications on file prior to visit.    No Known Allergies  Social History   Socioeconomic History   Marital status: Married    Spouse name: Not on file   Number of children: Not on file   Years of education: Not on file   Highest education level: Not on file  Occupational History   Not on file  Tobacco Use   Smoking status: Never Smoker   Smokeless tobacco: Never Used  Vaping Use   Vaping Use: Never used  Substance and Sexual Activity   Alcohol use: No    Alcohol/week: 0.0 standard drinks   Drug use: No   Sexual activity: Yes    Partners: Male    Birth control/protection: Surgical, I.U.D.    Comment: Husband had vasectomy   Other Topics Concern   Not on file  Social History Narrative   Works at [employer] as a [employer]   Lives with her husband  and 2 children.   Caffeine- 2 cups of tea.   Enjoys spending time with family.   Social Determinants of Health   Financial Resource Strain: Not on file  Food Insecurity: Not on file  Transportation Needs: Not on file  Physical Activity: Not on file  Stress: Not on file  Social Connections: Not on file  Intimate Partner Violence: Not on file    Family History  Problem Relation Age of Onset   Breast cancer Mother 52   Diabetes Father    Breast cancer Maternal Aunt 13       Lung Cancer   Stomach cancer Maternal Grandmother     The following portions of the patient's history were reviewed and updated as appropriate: allergies, current medications, past family history, past medical history, past social history, past surgical history and problem list.  Review of Systems  ROS negative except as noted above. Information obtained from patient.   Objective:   BP 112/78    Pulse 99    Ht 5\' 7"  (1.702 m)     Wt 164 lb 1.6 oz (74.4 kg)    BMI 25.70 kg/m   CONSTITUTIONAL: Well-developed, well-nourished female in no acute distress.   BREASTS:   Right breast normal without mass, skin or nipple changes or axillary nodes  Left breast abnormal due to bouncy ball sized round, soft, mobile mass present at 3 o'clock, tender to touch   Assessment:   1. Breast lump on left side at 3 o'clock position  - MM DIAG BREAST TOMO UNI LEFT; Future - BREAST LTD UNI LEFT INC AXILLA; Future  2. Family history of breast cancer in mother  - MM DIAG BREAST TOMO UNI LEFT; Future - US BREAST LTD UNI LEFT INC AXILLA; Future     Plan:   Encouraged decreased salt and caffeine, increased water, and use of vitamin E supplement or evening primrose oil.   Discussed Danazol use for management of fibrocystic breast changes, see AVS.   Ultrasound and mammogram ordered, see chart.   Reviewed red flag symptoms and when to call.   RTC as previously scheduled or sooner if needed.    [place], CNM [facility] 07/18/20 11:01 AM

## 2020-07-18 NOTE — Progress Notes (Signed)
Lump in Left breast, present for a couple days.  C/o discomfort.

## 2020-07-18 NOTE — Telephone Encounter (Signed)
Pt attempted to schedule mammogram they asked pt to call to get order changed to bilateral - states that it is time for her yearly mammogram. Please Advise.

## 2020-07-18 NOTE — Telephone Encounter (Signed)
Bilateral screening mammo ordered.   Pt aware via VM.

## 2020-07-18 NOTE — Progress Notes (Signed)
Mammogram order placed.    Whitney [name], CNM [facility], [facility] 07/18/20 2:13 PM

## 2020-07-18 NOTE — Patient Instructions (Signed)
Breast Pain/Cyst Dietary Changes  ~Decrease caffeine ~Decrease salt intake ~Increase daily water ~Consider Vitamin E or Evening Primrose Oil Supplement   Danazol capsules What is this medicine? DANAZOL (DA na zole) is used in women to treat endometriosis and the symptoms of fibrocystic breast disease. This medicine may also be used in men and women to prevent serious allergic reactions known as angioedema. This medicine may be used for other purposes; ask your health care provider or pharmacist if you have questions. COMMON BRAND NAME(S): Danocrine What should I tell my health care provider before I take this medicine? They need to know if you have any of these conditions:  breast cancer  heart disease  kidney disease  liver disease  porphyria  unusual vaginal bleeding  an unusual or allergic reaction to danazol, other medicines, foods, dyes, or preservatives  pregnant or trying to get pregnant  breast-feeding How should I use this medicine? Take this medicine by mouth with a glass of water. Follow the directions on the prescription label. Take this medicine with food to decrease stomach upset. Take your doses at regular intervals. Do not take your medicine more often than directed. Talk to your pediatrician regarding the use of this medicine in children. Special care may be needed. Overdosage: If you think you have taken too much of this medicine contact a poison control center or emergency room at once. NOTE: This medicine is only for you. Do not share this medicine with others. What if I miss a dose? If you miss a dose, take it as soon as you can. If it is almost time for your next dose, take only that dose. Do not take double or extra doses. What may interact with this medicine? Do not take this medicine with any of the following medications:  cisapride  pimozide  ranolazine This medicine may also interact with the following  medications:  carbamazepine  medicines that treat or prevent blood clots like warfarin This list may not describe all possible interactions. Give your health care provider a list of all the medicines, herbs, non-prescription drugs, or dietary supplements you use. Also tell them if you smoke, drink alcohol, or use illegal drugs. Some items may interact with your medicine. What should I watch for while using this medicine? Check with your doctor or health care professional if you are a female patient and notice any changes in your voice, decrease in breast size, or if hair starts growing on your face. This medicine should not be used in pregnancy. You should use a non-hormonal form of birth control while on this medicine. If you become pregnant or think you may be pregnant while you are taking this medicine, you should stop taking this medicine and contact your doctor or health care professional. This medicine may cause risk to a female fetus. This medicine can affect your menstrual cycle and you may stop having menstrual periods. These will return to normal within 2 to 3 months after you stop taking this medicine. What side effects may I notice from receiving this medicine? Side effects that you should report to your doctor or health care professional as soon as possible:  allergic reactions like skin rash, itching or hives, swelling of the face, lips, or tongue  changes in vision  dark urine  decrease in breast size  hair loss or unusual hair growth  headache  irregular vaginal bleeding, spotting  nausea, vomiting, stomach pain  redness, blistering, peeling or loosening of the skin, including inside the  mouth  unusual bleeding or bruising  unusual swelling of feet or ankles  unusually weak or tired  voice changes  weight gain  yellowing of the skin or eyes Side effects that usually do not require medical attention (report to your doctor or health care professional if they  continue or are bothersome):  acne, oily skin  hot flashes, sweating  mood changes  vaginal dryness or irritation This list may not describe all possible side effects. Call your doctor for medical advice about side effects. You may report side effects to FDA at 1-800-FDA-1088. Where should I keep my medicine? Keep out of the reach of children. Store at room temperature between 15 and 30 degrees C (59 and 86 degrees F). Throw away any unused medicine after the expiration date. NOTE: This sheet is a summary. It may not cover all possible information. If you have questions about this medicine, talk to your doctor, pharmacist, or health care provider.  2021 Elsevier/Gold Standard (2007-08-17 16:38:33)   Fibrocystic Breast Changes  Fibrocystic breast changes are changes in breast tissue that can cause breasts to become swollen, lumpy, or painful. This can happen due to buildup of scar-like tissue (fibrous tissue) or the forming of fluid-filled lumps (cysts) in the breast. Fibrocystic breast changes can affect one or both breasts. The condition is common, and it is not cancer. What are the causes? The exact cause of fibrocystic breast changes is not known. However, this condition may be:  Related to the female hormones estrogen and progesterone.  Influenced by family traits that get passed from parent to child (inherited). What are the signs or symptoms? Symptoms of this condition include:  Tenderness, swelling, mild discomfort, or pain.  Rope-like tissue that can be felt when touching the breast.  Lumps in one or both breasts.  Changes in breast size. Breasts may get larger before a menstrual period and smaller after a menstrual period.  Discharge from the nipple. Symptoms of this condition may affect one or both breasts and are usually worse before menstrual periods start. Symptoms usually get better toward the end of menstrual periods. How is this diagnosed? This condition is  diagnosed based on your medical history and a physical exam of your breasts. You may also have tests, such as:  A breast X-ray (mammogram).  Ultrasound.  MRI.  Removing a small sample of tissue from the breast for tests (breast biopsy). This may be done if your health care provider thinks that something else may be causing changes in your breasts. How is this treated? Often, treatment is not needed for this condition. In some cases, however, treatment may be needed, including:  Taking over-the-counter pain medicines to help relieve pain or discomfort.  Limiting or avoiding caffeine. Foods and beverages that contain caffeine include chocolate, soda, coffee, and tea.  Reducing sugar and fat in your diet. Treatment may also include:  A procedure to remove fluid from a cyst that is causing pain (fine needle aspiration).  Surgery to remove a cyst that is large or tender or does not go away.  Medicines that may lower the amount of female hormones. Follow these instructions at home: Self care  Check your breasts after every menstrual period. If you do not have menstrual periods, check your breasts on the first day of every month. Feel for changes in your breasts, such as: ? More tenderness. ? A new growth. ? A change in size. ? A change in an existing lump. General instructions  Take over-the-counter  and prescription medicines only as told by your health care provider.  Wear a well-fitting support or sports bra, especially when exercising.  If told by your health care provider, decrease or avoid caffeine, fat, and sugar in your diet.  Keep all follow-up visits as told by your health care provider. This is important. Contact a health care provider if:  You have fluid leaking from your nipple, especially if it is bloody.  You have new lumps or bumps in your breast.  Your breast becomes enlarged, red, and painful.  You have areas of your breast that pucker inward.  Your  nipple appears flat or indented. Get help right away if:  You have redness of your breast and the redness is spreading. Summary  Fibrocystic breast changes are changes in breast tissue that can cause breasts to become swollen, lumpy, or painful.  This condition may be related to the female hormones estrogen and progesterone.  With this condition, it is important to examine your breasts after every menstrual period. If you do not have menstrual periods, check your breasts on the first day of every month. This information is not intended to replace advice given to you by your health care provider. Make sure you discuss any questions you have with your health care provider. Document Revised: 04/09/2019 Document Reviewed: 04/09/2019 Elsevier Patient Education  2021 [employer].

## 2020-07-22 NOTE — Telephone Encounter (Signed)
Added to open message.  

## 2020-07-22 NOTE — Telephone Encounter (Signed)
Added from second message.   Disregard my previous message please. I found the other bottle I had of the topamax. I thought I had lost my mind. I knew I hadn't used it all. Sorry about that!

## 2020-07-25 ENCOUNTER — Other Ambulatory Visit: Payer: Self-pay

## 2020-07-25 ENCOUNTER — Ambulatory Visit
Admission: RE | Admit: 2020-07-25 | Discharge: 2020-07-25 | Disposition: A | Discharge status: Home / Self Care | Payer: BC Managed Care – PPO | Source: Ambulatory Visit | Attending: Certified Nurse Midwife | Admitting: Certified Nurse Midwife

## 2020-07-25 DIAGNOSIS — N6002 Solitary cyst of left breast: Secondary | ICD-10-CM | POA: Diagnosis not present

## 2020-07-25 DIAGNOSIS — N6325 Unspecified lump in the left breast, overlapping quadrants: Secondary | ICD-10-CM | POA: Diagnosis not present

## 2020-07-25 DIAGNOSIS — Z803 Family history of malignant neoplasm of breast: Secondary | ICD-10-CM

## 2020-07-25 DIAGNOSIS — R922 Inconclusive mammogram: Secondary | ICD-10-CM | POA: Diagnosis not present

## 2020-07-25 DIAGNOSIS — Z1231 Encounter for screening mammogram for malignant neoplasm of breast: Secondary | ICD-10-CM | POA: Diagnosis not present

## 2020-08-12 ENCOUNTER — Other Ambulatory Visit: Payer: Self-pay

## 2020-08-12 DIAGNOSIS — R519 Headache, unspecified: Secondary | ICD-10-CM

## 2020-08-12 MED ORDER — TOPIRAMATE 50 MG PO TABS: 50.0000 mg | ORAL_TABLET | Freq: Every day | ORAL | 1 refills | Status: DC

## 2020-08-27 DIAGNOSIS — R519 Headache, unspecified: Secondary | ICD-10-CM

## 2020-08-28 MED ORDER — PROPRANOLOL HCL ER 80 MG PO CP24: 80.0000 mg | ORAL_CAPSULE | Freq: Every day | ORAL | 0 refills | Status: DC

## 2020-10-07 NOTE — Patient Instructions (Addendum)
Preventive Care 49-49 Years Old, Female Preventive care refers to lifestyle choices and visits with your health care provider that can promote health and wellness. This includes:  A yearly physical exam. This is also called an annual wellness visit.  Regular dental and eye exams.  Immunizations.  Screening for certain conditions.  Healthy lifestyle choices, such as: ? Eating a healthy diet. ? Getting regular exercise. ? Not using drugs or products that contain nicotine and tobacco. ? Limiting alcohol use. What can I expect for my preventive care visit? Physical exam Your health care provider will check your:  Height and weight. These may be used to calculate your BMI (body mass index). BMI is a measurement that tells if you are at a healthy weight.  Heart rate and blood pressure.  Body temperature.  Skin for abnormal spots. Counseling Your health care provider may ask you questions about your:  Past medical problems.  Family's medical history.  Alcohol, tobacco, and drug use.  Emotional well-being.  Home life and relationship well-being.  Sexual activity.  Diet, exercise, and sleep habits.  Work and work Statistician.  Access to firearms.  Method of birth control.  Menstrual cycle.  Pregnancy history. What immunizations do I need? Vaccines are usually given at various ages, according to a schedule. Your health care provider will recommend vaccines for you based on your age, medical history, and lifestyle or other factors, such as travel or where you work.   What tests do I need? Blood tests  Lipid and cholesterol levels. These may be checked every 5 years, or more often if you are over 3 years old.  Hepatitis C test.  Hepatitis B test. Screening  Lung cancer screening. You may have this screening every year starting at age 73 if you have a 30-pack-year history of smoking and currently smoke or have quit within the past 15 years.  Colorectal cancer  screening. ? All adults should have this screening starting at age 52 and continuing until age 17. ? Your health care provider may recommend screening at age 49 if you are at increased risk. ? You will have tests every 1-10 years, depending on your results and the type of screening test.  Diabetes screening. ? This is done by checking your blood sugar (glucose) after you have not eaten for a while (fasting). ? You may have this done every 1-3 years.  Mammogram. ? This may be done every 1-2 years. ? Talk with your health care provider about when you should start having regular mammograms. This may depend on whether you have a family history of breast cancer.  BRCA-related cancer screening. This may be done if you have a family history of breast, ovarian, tubal, or peritoneal cancers.  Pelvic exam and Pap test. ? This may be done every 3 years starting at age 10. ? Starting at age 11, this may be done every 5 years if you have a Pap test in combination with an HPV test. Other tests  STD (sexually transmitted disease) testing, if you are at risk.  Bone density scan. This is done to screen for osteoporosis. You may have this scan if you are at high risk for osteoporosis. Talk with your health care provider about your test results, treatment options, and if necessary, the need for more tests. Follow these instructions at home: Eating and drinking  Eat a diet that includes fresh fruits and vegetables, whole grains, lean protein, and low-fat dairy products.  Take vitamin and mineral supplements  as recommended by your health care provider.  Do not drink alcohol if: ? Your health care provider tells you not to drink. ? You are pregnant, may be pregnant, or are planning to become pregnant.  If you drink alcohol: ? Limit how much you have to 0-1 drink a day. ? Be aware of how much alcohol is in your drink. In the [place] one drink equals one 12 oz bottle of beer (355 mL), one 5 oz glass of  wine (148 mL), or one 1 oz glass of hard liquor (44 mL).   Lifestyle  Take daily care of your teeth and gums. Brush your teeth every morning and night with fluoride toothpaste. Floss one time each day.  Stay active. Exercise for at least 30 minutes 5 or more days each week.  Do not use any products that contain nicotine or tobacco, such as cigarettes, e-cigarettes, and chewing tobacco. If you need help quitting, ask your health care provider.  Do not use drugs.  If you are sexually active, practice safe sex. Use a condom or other form of protection to prevent STIs (sexually transmitted infections).  If you do not wish to become pregnant, use a form of birth control. If you plan to become pregnant, see your health care provider for a prepregnancy visit.  If told by your health care provider, take low-dose aspirin daily starting at age 50.  Find healthy ways to cope with stress, such as: ? Meditation, yoga, or listening to music. ? Journaling. ? Talking to a trusted person. ? Spending time with friends and family. Safety  Always wear your seat belt while driving or riding in a vehicle.  Do not drive: ? If you have been drinking alcohol. Do not ride with someone who has been drinking. ? When you are tired or distracted. ? While texting.  Wear a helmet and other protective equipment during sports activities.  If you have firearms in your house, make sure you follow all gun safety procedures. What's next?  Visit your health care provider once a year for an annual wellness visit.  Ask your health care provider how often you should have your eyes and teeth checked.  Stay up to date on all vaccines. This information is not intended to replace advice given to you by your health care provider. Make sure you discuss any questions you have with your health care provider. Document Revised: 01/29/2020 Document Reviewed: 01/05/2018 Elsevier Patient Education  2021 Elsevier Inc. Breast  Self-Awareness Breast self-awareness is knowing how your breasts look and feel. Doing breast self-awareness is important. It allows you to catch a breast problem early while it is still small and can be treated. All women should do breast self-awareness, including women who have had breast implants. Tell your doctor if you notice a change in your breasts. What you need:  A mirror.  A well-lit room. How to do a breast self-exam A breast self-exam is one way to learn what is normal for your breasts and to check for changes. To do a breast self-exam: Look for changes 1. Take off all the clothes above your waist. 2. Stand in front of a mirror in a room with good lighting. 3. Put your hands on your hips. 4. Push your hands down. 5. Look at your breasts and nipples in the mirror to see if one breast or nipple looks different from the other. Check to see if: ? The shape of one breast is different. ? The size of   size of one breast is different. ? There are wrinkles, dips, and bumps in one breast and not the other. 6. Look at each breast for changes in the skin, such as: ? Redness. ? Scaly areas. 7. Look for changes in your nipples, such as: ? Liquid around the nipples. ? Bleeding. ? Dimpling. ? Redness. ? A change in where the nipples are.   Feel for changes 1. Lie on your back on the floor. 2. Feel each breast. To do this, follow these steps: ? Pick a breast to feel. ? Put the arm closest to that breast above your head. ? Use your other arm to feel the nipple area of your breast. Feel the area with the pads of your three middle fingers by making small circles with your fingers. For the first circle, press lightly. For the second circle, press harder. For the third circle, press even harder. ? Keep making circles with your fingers at the different pressures as you move down your breast. Stop when you feel your ribs. ? Move your fingers a little toward the center of your  body. ? Start making circles with your fingers again, this time going up until you reach your collarbone. ? Keep making up-and-down circles until you reach your armpit. Remember to keep using the three pressures. ? Feel the other breast in the same way. 3. Sit or stand in the tub or shower. 4. With soapy water on your skin, feel each breast the same way you did in step 2 when you were lying on the floor.   Write down what you find Writing down what you find can help you remember what to tell your doctor. Write down:  What is normal for each breast.  Any changes you find in each breast, including: ? The kind of changes you find. ? Whether you have pain. ? Size and location of any lumps.  When you last had your menstrual period. General tips  Check your breasts every month.  If you are breastfeeding, the best time to check your breasts is after you feed your baby or after you use a breast pump.  If you get menstrual periods, the best time to check your breasts is 5-7 days after your menstrual period is over.  With time, you will become comfortable with the self-exam, and you will begin to know if there are changes in your breasts. Contact a doctor if you:  See a change in the shape or size of your breasts or nipples.  See a change in the skin of your breast or nipples, such as red or scaly skin.  Have fluid coming from your nipples that is not normal.  Find a lump or thick area that was not there before.  Have pain in your breasts.  Have any concerns about your breast health. Summary  Breast self-awareness includes looking for changes in your breasts, as well as feeling for changes within your breasts.  Breast self-awareness should be done in front of a mirror in a well-lit room.  You should check your breasts every month. If you get menstrual periods, the best time to check your breasts is 5-7 days after your menstrual period is over.  Let your doctor know of any changes  you see in your breasts, including changes in size, changes on the skin, pain or tenderness, or fluid from your nipples that is not normal. This information is not intended to replace advice given to you by your health care provider.  Make sure you discuss any questions you have with your health care provider. Document Revised: 12/13/2017 Document Reviewed: 12/13/2017 Elsevier Patient Education  [employer].

## 2020-10-08 ENCOUNTER — Encounter: Payer: Self-pay | Admitting: Obstetrics and Gynecology

## 2020-10-08 ENCOUNTER — Other Ambulatory Visit: Payer: Self-pay

## 2020-10-08 ENCOUNTER — Ambulatory Visit (INDEPENDENT_AMBULATORY_CARE_PROVIDER_SITE_OTHER): Payer: BC Managed Care – PPO | Admitting: Obstetrics and Gynecology

## 2020-10-08 VITALS — BP 118/83 | HR 80 | Ht 67.0 in | Wt 161.0 lb

## 2020-10-08 DIAGNOSIS — Z975 Presence of (intrauterine) contraceptive device: Secondary | ICD-10-CM | POA: Diagnosis not present

## 2020-10-08 DIAGNOSIS — Z01419 Encounter for gynecological examination (general) (routine) without abnormal findings: Secondary | ICD-10-CM | POA: Diagnosis not present

## 2020-10-08 DIAGNOSIS — Z803 Family history of malignant neoplasm of breast: Secondary | ICD-10-CM | POA: Diagnosis not present

## 2020-10-08 DIAGNOSIS — E785 Hyperlipidemia, unspecified: Secondary | ICD-10-CM | POA: Diagnosis not present

## 2020-10-08 NOTE — Progress Notes (Signed)
Pt present for annual exam. Pt stated that she was doing well.  

## 2020-10-08 NOTE — Progress Notes (Signed)
GYNECOLOGY ANNUAL PHYSICAL EXAM PROGRESS NOTE  Subjective:    Whitney [name] is a 50 y.o. [id number] female who presents for an annual exam. The patient has no complaints today. The patient is sexually active.  The patient participates in regular exercise: (some walking and riding bike). Has the patient ever been transfused or tattooed?: no.    Gynecologic History  Menarche age: 82 Patient's last menstrual period was 10/07/2020. Cycles are 1-3 days, q monthly, light flow.  Contraception: IUD - Mirena inserted 05/2016. History of STI's: Denies Last Pap: 04/11/2018. Results were: normal.  Denies h/o abnormal pap smears. Last mammogram: 07/25/2020. Results were: normal   OB History  Gravida Para Term Preterm AB Living  3 2 2  0 1 2  SAB IAB Ectopic Multiple Live Births  0 1 0 0 0    # Outcome Date GA Lbr Len/2nd Weight Sex Delivery Anes PTL Lv  3 IAB           2 Term           1 Term             Past Medical History:  Diagnosis Date  . Anxiety   . Depression   . Frequent headaches   . Hx of migraine headaches   . Migraines   . Urinary tract infection     Past Surgical History:  Procedure Laterality Date  . BREAST BIOPSY Left 03/04/2015   FIBROCYSTIC CHANGES stereo    Family History  Problem Relation Age of Onset  . Breast cancer Mother 36  . Diabetes Father   . Breast cancer Maternal Aunt 61       Lung Cancer  . Stomach cancer Maternal Grandmother     Social History   Socioeconomic History  . Marital status: Married    Spouse name: Not on file  . Number of children: Not on file  . Years of education: Not on file  . Highest education level: Not on file  Occupational History  . Not on file  Tobacco Use  . Smoking status: Never Smoker  . Smokeless tobacco: Never Used  Vaping Use  . Vaping Use: Never used  Substance and Sexual Activity  . Alcohol use: No    Alcohol/week: 0.0 standard drinks  . Drug use: No  . Sexual activity: Yes    Partners: Male     Birth control/protection: Surgical, I.U.D.    Comment: Husband had vasectomy   Other Topics Concern  . Not on file  Social History Narrative   Works at 63 as a [employer]   Lives with her husband and 2 children.   Caffeine- 2 cups of tea.   Enjoys spending time with family.   Social Determinants of Health   Financial Resource Strain: Not on file  Food Insecurity: Not on file  Transportation Needs: Not on file  Physical Activity: Not on file  Stress: Not on file  Social Connections: Not on file  Intimate Partner Violence: Not on file    Current Outpatient Medications on File Prior to Visit  Medication Sig Dispense Refill  . ALPRAZolam (XANAX) 0.5 MG tablet Take 0.5 mg by mouth as needed.     . DULoxetine (CYMBALTA) 60 MG capsule Take 60 mg by mouth daily.    Psychologist, forensic levonorgestrel (MIRENA) 20 MCG/24HR IUD 1 each by Intrauterine route once.    Marland Kitchen MELATONIN PO Take 1 tablet by mouth at bedtime.    . sertraline (  ZOLOFT) 100 MG tablet Take 200 mg by mouth daily.    . SUMAtriptan (IMITREX) 50 MG tablet TAKE 1 TABLET BY MOUTH AT MIGRAINE ONSET. MAY REPEAT IN 2 HOURS IF HEADACHE SYMPTOMS PERSISTS OR RECURS 10 tablet 0  . propranolol ER (INDERAL LA) 80 MG 24 hr capsule Take 1 capsule (80 mg total) by mouth daily. For headache prevention. (Patient not taking: Reported on 10/08/2020) 30 capsule 0   No current facility-administered medications on file prior to visit.    No Known Allergies    Review of Systems Constitutional: negative for chills, fatigue, fevers and sweats Eyes: negative for irritation, redness and visual disturbance Ears, nose, mouth, throat, and face: negative for hearing loss, nasal congestion, snoring and tinnitus Respiratory: negative for asthma, cough, sputum Cardiovascular: negative for chest pain, dyspnea, exertional chest pressure/discomfort, irregular heart beat, palpitations and syncope Gastrointestinal: negative for abdominal pain, change in  bowel habits, nausea and vomiting Genitourinary: negative for abnormal menstrual periods, genital lesions, sexual problems and vaginal discharge, dysuria and urinary incontinence Integument/breast: negative for breast lump, breast tenderness and nipple discharge Hematologic/lymphatic: negative for bleeding and easy bruising Musculoskeletal:negative for back pain and muscle weakness Neurological: negative for dizziness, headaches, vertigo and weakness Endocrine: negative for diabetic symptoms including polydipsia, polyuria and skin dryness Allergic/Immunologic: negative for hay fever and urticaria       Objective:  Blood pressure 118/83, pulse 80, height 5\' 7"  (1.702 m), weight 161 lb (73 kg), last menstrual period 10/07/2020. Body mass index is 25.22 kg/m.   General Appearance:    Alert, cooperative, no distress, appears stated age.   Head:    Normocephalic, without obvious abnormality, atraumatic  Eyes:    PERRL, conjunctiva/corneas clear, EOM's intact, both eyes  Ears:    Normal external ear canals, both ears  Nose:   Nares normal, septum midline, mucosa normal, no drainage or sinus tenderness  Throat:   Lips, mucosa, and tongue normal; teeth and gums normal  Neck:   Supple, symmetrical, trachea midline, no adenopathy; thyroid: no enlargement/tenderness/nodules; no carotid bruit or JVD  Back:     Symmetric, no curvature, ROM normal, no CVA tenderness  Lungs:     Clear to auscultation bilaterally, respirations unlabored  Chest Wall:    No tenderness or deformity   Heart:    Regular rhythm, S1 and S2 normal, no murmur, rub or gallop. Mild tachycardia (pulse 104 on exam).   Breast Exam:    No tenderness, masses, or nipple abnormality. Fibrocystic changes noted in right breast.   Abdomen:     Soft, non-tender, bowel sounds active all four quadrants, no masses, no organomegaly.    Genitalia:    Pelvic:external genitalia normal, vagina without lesions, discharge, or tenderness, rectovaginal  septum  normal. Cervix normal in appearance, no cervical motion tenderness, IUD threads visualized ~ 3 cm in length, no adnexal masses or tenderness.  Uterus normal size, shape, mobile, regular contours, nontender.  Rectal:    Normal external sphincter.  No hemorrhoids appreciated. Internal exam not done.   Extremities:   Extremities normal, atraumatic, no cyanosis or edema  Pulses:   2+ and symmetric all extremities  Skin:   Skin color, texture, turgor normal, no rashes or lesions  Lymph nodes:   Cervical, supraclavicular, and axillary nodes normal  Neurologic:   CNII-XII intact, normal strength, sensation and reflexes throughout   .  Labs:  Lab Results  Component Value Date   WBC 6.1 06/24/2020   HGB 14.2 06/24/2020  HCT 41.6 06/24/2020   MCV 92.4 06/24/2020   PLT 232.0 06/24/2020    Lab Results  Component Value Date   CREATININE 0.88 06/24/2020   BUN 19 06/24/2020   NA 140 06/24/2020   K 4.3 06/24/2020   CL 107 06/24/2020   CO2 27 06/24/2020    Lab Results  Component Value Date   ALT 13 06/24/2020   AST 14 06/24/2020   ALKPHOS 56 06/24/2020   BILITOT 0.3 06/24/2020    Lab Results  Component Value Date   TSH 1.62 05/28/2019    Lab Results  Component Value Date   CHOL 224 (H) 06/24/2020   HDL 42.60 06/24/2020   LDLCALC 163 (H) 05/28/2019   LDLDIRECT 157.0 06/24/2020   TRIG 274.0 (H) 06/24/2020   CHOLHDL 5 06/24/2020    Assessment:   Encounter for well woman exam with routine gynecological exam  Dyslipidemia IUD (intrauterine device) in place Family history of breast cancer in first degree relative   Plan:    Blood tests: Labs reviewed, performed by PCP.  Breast self exam technique reviewed and patient encouraged to perform self-exam monthly. Contraception: IUD. Mirena due for removal 2025 (advised that IUD can now remain in place for 7 years instead of 5). Discussed healthy lifestyle modifications. Encouraged exercise and low-fat/cholesterol  diet. Mammogram up to date. New order placed for annual surveillance.  Pap smear up to date. Will perform next year.  Revisited discussion on  risks/benefits of hereditary screening, especially in the setting of patient having dense breasts which can obscure small masses and family history of breast cancer. Patient still undecided.   Has completed COVID vaccination series.  Follow up in 1 year for annual exam.     Hildred Laser, MD [facility]

## 2020-10-10 ENCOUNTER — Other Ambulatory Visit: Payer: Self-pay

## 2020-10-10 DIAGNOSIS — G43901 Migraine, unspecified, not intractable, with status migrainosus: Secondary | ICD-10-CM

## 2020-10-10 MED ORDER — SUMATRIPTAN SUCCINATE 50 MG PO TABS: ORAL_TABLET | ORAL | 0 refills | Status: DC

## 2020-10-29 DIAGNOSIS — F330 Major depressive disorder, recurrent, mild: Secondary | ICD-10-CM | POA: Diagnosis not present

## 2020-10-29 DIAGNOSIS — G4700 Insomnia, unspecified: Secondary | ICD-10-CM | POA: Diagnosis not present

## 2020-10-29 DIAGNOSIS — F411 Generalized anxiety disorder: Secondary | ICD-10-CM | POA: Diagnosis not present

## 2020-11-04 ENCOUNTER — Other Ambulatory Visit: Payer: Self-pay

## 2020-11-04 DIAGNOSIS — R519 Headache, unspecified: Secondary | ICD-10-CM

## 2020-11-06 ENCOUNTER — Other Ambulatory Visit: Payer: Self-pay

## 2020-11-06 DIAGNOSIS — R519 Headache, unspecified: Secondary | ICD-10-CM

## 2020-11-07 ENCOUNTER — Other Ambulatory Visit: Payer: Self-pay | Admitting: Primary Care

## 2020-11-07 DIAGNOSIS — R519 Headache, unspecified: Secondary | ICD-10-CM

## 2020-12-23 ENCOUNTER — Telehealth: Payer: BC Managed Care – PPO | Admitting: Family Medicine

## 2020-12-23 DIAGNOSIS — B9789 Other viral agents as the cause of diseases classified elsewhere: Secondary | ICD-10-CM | POA: Diagnosis not present

## 2020-12-23 DIAGNOSIS — J329 Chronic sinusitis, unspecified: Secondary | ICD-10-CM | POA: Diagnosis not present

## 2020-12-23 MED ORDER — FLUTICASONE PROPIONATE 50 MCG/ACT NA SUSP: 2.0000 | Freq: Every day | NASAL | 6 refills | Status: DC

## 2020-12-23 NOTE — Progress Notes (Signed)

## 2021-01-15 ENCOUNTER — Other Ambulatory Visit: Payer: Self-pay

## 2021-01-15 DIAGNOSIS — G43901 Migraine, unspecified, not intractable, with status migrainosus: Secondary | ICD-10-CM

## 2021-01-15 MED ORDER — SUMATRIPTAN SUCCINATE 50 MG PO TABS: ORAL_TABLET | ORAL | 0 refills | Status: DC

## 2021-01-28 DIAGNOSIS — M791 Myalgia, unspecified site: Secondary | ICD-10-CM | POA: Diagnosis not present

## 2021-01-28 DIAGNOSIS — Z20822 Contact with and (suspected) exposure to covid-19: Secondary | ICD-10-CM | POA: Diagnosis not present

## 2021-01-28 DIAGNOSIS — J069 Acute upper respiratory infection, unspecified: Secondary | ICD-10-CM | POA: Diagnosis not present

## 2021-03-03 DIAGNOSIS — F330 Major depressive disorder, recurrent, mild: Secondary | ICD-10-CM | POA: Diagnosis not present

## 2021-03-03 DIAGNOSIS — G4700 Insomnia, unspecified: Secondary | ICD-10-CM | POA: Diagnosis not present

## 2021-03-03 DIAGNOSIS — F411 Generalized anxiety disorder: Secondary | ICD-10-CM | POA: Diagnosis not present

## 2021-04-29 ENCOUNTER — Other Ambulatory Visit: Payer: Self-pay | Admitting: Primary Care

## 2021-04-29 DIAGNOSIS — R519 Headache, unspecified: Secondary | ICD-10-CM

## 2021-05-11 ENCOUNTER — Other Ambulatory Visit: Payer: Self-pay | Admitting: Primary Care

## 2021-05-11 DIAGNOSIS — G43901 Migraine, unspecified, not intractable, with status migrainosus: Secondary | ICD-10-CM

## 2021-05-12 ENCOUNTER — Other Ambulatory Visit: Payer: Self-pay | Admitting: Primary Care

## 2021-05-12 DIAGNOSIS — G43901 Migraine, unspecified, not intractable, with status migrainosus: Secondary | ICD-10-CM

## 2021-05-18 ENCOUNTER — Other Ambulatory Visit: Payer: Self-pay

## 2021-05-18 ENCOUNTER — Other Ambulatory Visit (HOSPITAL_COMMUNITY)
Admission: RE | Admit: 2021-05-18 | Discharge: 2021-05-18 | Disposition: A | Discharge status: Home / Self Care | Payer: BC Managed Care – PPO | Source: Ambulatory Visit | Attending: Obstetrics and Gynecology | Admitting: Obstetrics and Gynecology

## 2021-05-18 ENCOUNTER — Ambulatory Visit: Payer: BC Managed Care – PPO

## 2021-05-18 ENCOUNTER — Ambulatory Visit (INDEPENDENT_AMBULATORY_CARE_PROVIDER_SITE_OTHER): Payer: BC Managed Care – PPO | Admitting: Obstetrics and Gynecology

## 2021-05-18 VITALS — BP 115/79 | Temp 97.6°F | Ht 68.0 in | Wt 173.0 lb

## 2021-05-18 DIAGNOSIS — N898 Other specified noninflammatory disorders of vagina: Secondary | ICD-10-CM | POA: Insufficient documentation

## 2021-05-18 DIAGNOSIS — R319 Hematuria, unspecified: Secondary | ICD-10-CM

## 2021-05-18 NOTE — Progress Notes (Signed)
Pt stated --yellow discharge, fishy smells, bleeding spotting--3 weeks. Allowed to collect self-swab today.

## 2021-05-19 DIAGNOSIS — R319 Hematuria, unspecified: Secondary | ICD-10-CM | POA: Diagnosis not present

## 2021-05-20 ENCOUNTER — Encounter: Payer: Self-pay | Admitting: Obstetrics and Gynecology

## 2021-05-20 LAB — CERVICOVAGINAL ANCILLARY ONLY
Bacterial Vaginitis (gardnerella): POSITIVE — AB
Candida Glabrata: NEGATIVE
Candida Vaginitis: POSITIVE — AB
Chlamydia: NEGATIVE
Comment: NEGATIVE
Comment: NEGATIVE
Comment: NEGATIVE
Comment: NEGATIVE
Comment: NEGATIVE
Comment: NORMAL
Neisseria Gonorrhea: NEGATIVE
Trichomonas: NEGATIVE

## 2021-05-20 MED ORDER — FLUCONAZOLE 150 MG PO TABS: 150.0000 mg | ORAL_TABLET | Freq: Once | ORAL | 3 refills | Status: AC

## 2021-05-20 MED ORDER — METRONIDAZOLE 500 MG PO TABS: 500.0000 mg | ORAL_TABLET | Freq: Two times a day (BID) | ORAL | 0 refills | Status: AC

## 2021-05-22 LAB — URINE CULTURE

## 2021-05-22 MED ORDER — SULFAMETHOXAZOLE-TRIMETHOPRIM 800-160 MG PO TABS: 1.0000 | ORAL_TABLET | Freq: Two times a day (BID) | ORAL | 0 refills | Status: DC

## 2021-05-22 NOTE — Addendum Note (Signed)
Addended by: Fabian November on: 05/22/2021 10:22 PM   Modules accepted: Orders

## 2021-06-23 DIAGNOSIS — G4700 Insomnia, unspecified: Secondary | ICD-10-CM | POA: Diagnosis not present

## 2021-06-23 DIAGNOSIS — F330 Major depressive disorder, recurrent, mild: Secondary | ICD-10-CM | POA: Diagnosis not present

## 2021-06-23 DIAGNOSIS — F411 Generalized anxiety disorder: Secondary | ICD-10-CM | POA: Diagnosis not present

## 2021-08-05 ENCOUNTER — Other Ambulatory Visit: Payer: Self-pay | Admitting: Primary Care

## 2021-08-05 DIAGNOSIS — R519 Headache, unspecified: Secondary | ICD-10-CM

## 2021-08-06 ENCOUNTER — Other Ambulatory Visit: Payer: Self-pay | Admitting: Primary Care

## 2021-08-06 DIAGNOSIS — R519 Headache, unspecified: Secondary | ICD-10-CM

## 2021-08-06 NOTE — Telephone Encounter (Signed)
Office visit required for further refills.  ?Thanks! ?

## 2021-08-26 ENCOUNTER — Encounter: Payer: Self-pay | Admitting: Primary Care

## 2021-08-26 ENCOUNTER — Telehealth: Payer: Self-pay

## 2021-08-26 ENCOUNTER — Ambulatory Visit: Payer: BC Managed Care – PPO | Admitting: Primary Care

## 2021-08-26 VITALS — BP 108/62 | HR 78 | Temp 99.9°F | Ht 68.0 in | Wt 173.0 lb

## 2021-08-26 DIAGNOSIS — R519 Headache, unspecified: Secondary | ICD-10-CM

## 2021-08-26 DIAGNOSIS — Z1231 Encounter for screening mammogram for malignant neoplasm of breast: Secondary | ICD-10-CM

## 2021-08-26 DIAGNOSIS — Z0000 Encounter for general adult medical examination without abnormal findings: Secondary | ICD-10-CM | POA: Diagnosis not present

## 2021-08-26 DIAGNOSIS — G43809 Other migraine, not intractable, without status migrainosus: Secondary | ICD-10-CM

## 2021-08-26 DIAGNOSIS — Z1211 Encounter for screening for malignant neoplasm of colon: Secondary | ICD-10-CM

## 2021-08-26 DIAGNOSIS — K5901 Slow transit constipation: Secondary | ICD-10-CM

## 2021-08-26 DIAGNOSIS — F418 Other specified anxiety disorders: Secondary | ICD-10-CM | POA: Diagnosis not present

## 2021-08-26 DIAGNOSIS — E785 Hyperlipidemia, unspecified: Secondary | ICD-10-CM

## 2021-08-26 MED ORDER — PROPRANOLOL HCL ER 80 MG PO CP24: 80.0000 mg | ORAL_CAPSULE | Freq: Every day | ORAL | 3 refills | Status: DC

## 2021-08-26 NOTE — Patient Instructions (Signed)
Stop by the lab prior to leaving today. I will notify you of your results once received.   You will be contacted regarding your referral to GI for the colonoscopy.  Please let us know if you have not been contacted within two weeks.   [facility] to schedule your mamm[facility].   It was a pleasure to see you today!  Preventive Care 40-50 Years Old, Female Preventive care refers to lifestyle choices and visits with your health care provider that can promote health and wellness. Preventive care visits are also called wellness exams. What can I expect for my preventive care visit? Counseling Your health care provider may ask you questions about your: Medical history, including: Past medical problems. Family medical history. Pregnancy history. Current health, including: Menstrual cycle. Method of birth control. Emotional well-being. Home life and relationship well-being. Sexual activity and sexual health. Lifestyle, including: Alcohol, nicotine or tobacco, and drug use. Access to firearms. Diet, exercise, and sleep habits. Work and work environment. Sunscreen use. Safety issues such as seatbelt and bike helmet use. Physical exam Your health care provider will check your: Height and weight. These may be used to calculate your BMI (body mass index). BMI is a measurement that tells if you are at a healthy weight. Waist circumference. This measures the distance around your waistline. This measurement also tells if you are at a healthy weight and may help predict your risk of certain diseases, such as type 2 diabetes and high blood pressure. Heart rate and blood pressure. Body temperature. Skin for abnormal spots. What immunizations do I need?  Vaccines are usually given at various ages, according to a schedule. Your health care provider will recommend vaccines for you based on your age, medical history, and lifestyle or other factors, such as travel or where you work. What  tests do I need? Screening Your health care provider may recommend screening tests for certain conditions. This may include: Lipid and cholesterol levels. Diabetes screening. This is done by checking your blood sugar (glucose) after you have not eaten for a while (fasting). Pelvic exam and Pap test. Hepatitis B test. Hepatitis C test. HIV (human immunodeficiency virus) test. STI (sexually transmitted infection) testing, if you are at risk. Lung cancer screening. Colorectal cancer screening. Mammogram. Talk with your health care provider about when you should start having regular mammograms. This may depend on whether you have a family history of breast cancer. BRCA-related cancer screening. This may be done if you have a family history of breast, ovarian, tubal, or peritoneal cancers. Bone density scan. This is done to screen for osteoporosis. Talk with your health care provider about your test results, treatment options, and if necessary, the need for more tests. Follow these instructions at home: Eating and drinking  Eat a diet that includes fresh fruits and vegetables, whole grains, lean protein, and low-fat dairy products. Take vitamin and mineral supplements as recommended by your health care provider. Do not drink alcohol if: Your health care provider tells you not to drink. You are pregnant, may be pregnant, or are planning to become pregnant. If you drink alcohol: Limit how much you have to 0-1 drink a day. Know how much alcohol is in your drink. In the [place] one drink equals one 12 oz bottle of beer (355 mL), one 5 oz glass of wine (148 mL), or one 1 oz glass of hard liquor (44 mL). Lifestyle Brush your teeth every morning and night with fluoride toothpaste. Floss one time each   day. ?Exercise for at least 30 minutes 5 or more days each week. ?Do not use any products that contain nicotine or tobacco. These products include cigarettes, chewing tobacco, and vaping devices, such as  e-cigarettes. If you need help quitting, ask your health care provider. ?Do not use drugs. ?If you are sexually active, practice safe sex. Use a condom or other form of protection to prevent STIs. ?If you do not wish to become pregnant, use a form of birth control. If you plan to become pregnant, see your health care provider for a prepregnancy visit. ?Take aspirin only as told by your health care provider. Make sure that you understand how much to take and what form to take. Work with your health care provider to find out whether it is safe and beneficial for you to take aspirin daily. ?Find healthy ways to manage stress, such as: ?Meditation, yoga, or listening to music. ?Journaling. ?Talking to a trusted person. ?Spending time with friends and family. ?Minimize exposure to UV radiation to reduce your risk of skin cancer. ?Safety ?Always wear your seat belt while driving or riding in a vehicle. ?Do not drive: ?If you have been drinking alcohol. Do not ride with someone who has been drinking. ?When you are tired or distracted. ?While texting. ?If you have been using any mind-altering substances or drugs. ?Wear a helmet and other protective equipment during sports activities. ?If you have firearms in your house, make sure you follow all gun safety procedures. ?Seek help if you have been physically or sexually abused. ?What's next? ?Visit your health care provider once a year for an annual wellness visit. ?Ask your health care provider how often you should have your eyes and teeth checked. ?Stay up to date on all vaccines. ?This information is not intended to replace advice given to you by your health care provider. Make sure you discuss any questions you have with your health care provider. ?Document Revised: 10/22/2020 Document Reviewed: 10/22/2020 ?Elsevier Patient Education ? 2023 [employer] ? ?

## 2021-08-26 NOTE — Assessment & Plan Note (Signed)
Discussed the importance of a healthy diet and regular exercise in order for weight loss, and to reduce the risk of further co-morbidity.  Repeat lipid panel pending. 

## 2021-08-26 NOTE — Assessment & Plan Note (Signed)
Stable and controlled per patient. ? ?Continue propanolol ER 80 mg daily, sumatriptan 50 mg as needed. ?

## 2021-08-26 NOTE — Assessment & Plan Note (Signed)
Denies concerns today. °Continue to monitor. °

## 2021-08-26 NOTE — Addendum Note (Signed)
Addended by: Doreene Nest on: 08/26/2021 07:44 AM ? ? Modules accepted: Orders ? ?

## 2021-08-26 NOTE — Assessment & Plan Note (Signed)
Immunizations up-to-date. ?Pap smear up-to-date, follows with GYN. ? ?Mammogram due, orders placed. ?Colonoscopy due, referral placed to GI. ? ?Discussed the importance of a healthy diet and regular exercise in order for weight loss, and to reduce the risk of further co-morbidity. ? ?Exam today stable. ?Labs pending. ?

## 2021-08-26 NOTE — Progress Notes (Signed)
? ?Subjective:  ? ? Patient ID: Whitney [name], female    DOB: 08/21/1970, 51 y.o.   MRN: [id number] ? ?HPI ? ?Whitney [name] is a very pleasant 51 y.o. female who presents today for complete physical and follow up of chronic conditions. ? ?Immunizations: ?-Tetanus: 2016 ?-Influenza: Did not complete last season ?-Covid-19: 2 vaccines ? ?Diet: Fair diet.  ?Exercise: No regular exercise. ? ?Eye exam: Completes annually  ?Dental exam: Completes semi-annually  ? ?Pap Smear: Completed in 2019, follows with GYN ?Mammogram: Completed in March 2022 ?Colonoscopy: Never completed ? ?BP Readings from Last 3 Encounters:  ?08/26/21 108/62  ?05/18/21 115/79  ?10/08/20 118/83  ? ? ? ? ? ? ? ?Review of Systems  ?Constitutional:  Negative for unexpected weight change.  ?HENT:  Negative for rhinorrhea.   ?Eyes:  Negative for visual disturbance.  ?Respiratory:  Negative for cough and shortness of breath.   ?Cardiovascular:  Negative for chest pain.  ?Gastrointestinal:  Negative for constipation and diarrhea.  ?Genitourinary:  Negative for difficulty urinating.  ?Musculoskeletal:  Negative for arthralgias and myalgias.  ?Skin:  Negative for rash.  ?Allergic/Immunologic: Negative for environmental allergies.  ?Neurological:  Positive for headaches. Negative for dizziness.  ?Psychiatric/Behavioral:  The patient is not nervous/anxious.   ? ?   ? ? ?Past Medical History:  ?Diagnosis Date  ? Anxiety   ? Depression   ? Frequent headaches   ? Hx of migraine headaches   ? Migraines   ? Urinary tract infection   ? ? ?Social History  ? ?Socioeconomic History  ? Marital status: Married  ?  Spouse name: Not on file  ? Number of children: Not on file  ? Years of education: Not on file  ? Highest education level: Not on file  ?Occupational History  ? Not on file  ?Tobacco Use  ? Smoking status: Never  ? Smokeless tobacco: Never  ?Vaping Use  ? Vaping Use: Never used  ?Substance and Sexual Activity  ? Alcohol use: No  ?  Alcohol/week: 0.0  standard drinks  ? Drug use: No  ? Sexual activity: Yes  ?  Partners: Male  ?  Birth control/protection: Surgical, I.U.D.  ?  Comment: Husband had vasectomy   ?Other Topics Concern  ? Not on file  ?Social History Narrative  ? Works at [employer] as a Psychologist, forensic  ? Lives with her husband and 2 children.  ? Caffeine- 2 cups of tea.  ? Enjoys spending time with family.  ? ?Social Determinants of Health  ? ?Financial Resource Strain: Not on file  ?Food Insecurity: Not on file  ?Transportation Needs: Not on file  ?Physical Activity: Not on file  ?Stress: Not on file  ?Social Connections: Not on file  ?Intimate Partner Violence: Not on file  ? ? ?Past Surgical History:  ?Procedure Laterality Date  ? BREAST BIOPSY Left 03/04/2015  ? FIBROCYSTIC CHANGES stereo  ? ? ?Family History  ?Problem Relation Age of Onset  ? Breast cancer Mother 65  ? Diabetes Father   ? Breast cancer Maternal Aunt 54  ?     Lung Cancer  ? Stomach cancer Maternal Grandmother   ? ? ?No Known Allergies ? ?Current Outpatient Medications on File Prior to Visit  ?Medication Sig Dispense Refill  ? ALPRAZolam (XANAX) 0.5 MG tablet Take 0.5 mg by mouth as needed.     ? DULoxetine (CYMBALTA) 60 MG capsule Take 60 mg by mouth daily.    ?  levonorgestrel (MIRENA) 20 MCG/24HR IUD 1 each by Intrauterine route once.    ? MELATONIN PO Take 1 tablet by mouth at bedtime.    ? propranolol ER (INDERAL LA) 80 MG 24 hr capsule Take 1 capsule (80 mg total) by mouth daily. For headache prevention. Office visit required for further refills. 30 capsule 0  ? sertraline (ZOLOFT) 100 MG tablet Take 200 mg by mouth daily.    ? SUMAtriptan (IMITREX) 50 MG tablet TAKE 1 TABLET BY MOUTH AT MIGRAINE ONSET. MAY REPEAT IN 2 HOURS IF HEADACHE SYMPTOMS PERSISTS OR RECURS 10 tablet 0  ? ?No current facility-administered medications on file prior to visit.  ? ? ?BP 108/62   Pulse 78   Temp 99.9 ?F (37.7 ?C) (Oral)   Ht 5\' 8"  (1.727 m)   Wt 173 lb (78.5 kg)   SpO2 97%   BMI  26.30 kg/m?  ?Objective:  ? Physical Exam ?HENT:  ?   Right Ear: Tympanic membrane and ear canal normal.  ?   Left Ear: Tympanic membrane and ear canal normal.  ?   Nose: Nose normal.  ?Eyes:  ?   Conjunctiva/sclera: Conjunctivae normal.  ?   Pupils: Pupils are equal, round, and reactive to light.  ?Neck:  ?   Thyroid: No thyromegaly.  ?Cardiovascular:  ?   Rate and Rhythm: Normal rate and regular rhythm.  ?   Heart sounds: No murmur heard. ?Pulmonary:  ?   Effort: Pulmonary effort is normal.  ?   Breath sounds: Normal breath sounds. No rales.  ?Abdominal:  ?   General: Bowel sounds are normal.  ?   Palpations: Abdomen is soft.  ?   Tenderness: There is no abdominal tenderness.  ?Musculoskeletal:     ?   General: Normal range of motion.  ?   Cervical back: Neck supple.  ?Lymphadenopathy:  ?   Cervical: No cervical adenopathy.  ?Skin: ?   General: Skin is warm and dry.  ?   Findings: No rash.  ?Neurological:  ?   Mental Status: She is alert and oriented to person, place, and time.  ?   Cranial Nerves: No cranial nerve deficit.  ?   Deep Tendon Reflexes: Reflexes are normal and symmetric.  ?Psychiatric:     ?   Mood and Affect: Mood normal.  ? ? ? ? ? ?   ?Assessment & Plan:  ? ? ? ? ?This visit occurred during the SARS-CoV-2 public health emergency.  Safety protocols were in place, including screening questions prior to the visit, additional usage of staff PPE, and extensive cleaning of exam room while observing appropriate contact time as indicated for disinfecting solutions.  ?

## 2021-08-26 NOTE — Assessment & Plan Note (Signed)
Controlled. ? ?Following with psychiatry.  ?Continue Cymbalta 60 mg daily, sertraline 100 mg, alprazolam 0.5 mg PRN. ? ? ?

## 2021-08-26 NOTE — Telephone Encounter (Signed)
LVM for pt to return my call.

## 2021-08-26 NOTE — Assessment & Plan Note (Signed)
Overall controlled.  ? ?Continue propranolol ER 80 mg daily. ?Continue sumatriptan 50 mg PRN. ? ?Continue to monitor.  ?

## 2021-08-27 ENCOUNTER — Telehealth: Payer: Self-pay

## 2021-08-27 LAB — COMPREHENSIVE METABOLIC PANEL
ALT: 20 IU/L (ref 0–32)
AST: 24 IU/L (ref 0–40)
Albumin/Globulin Ratio: 1.7 (ref 1.2–2.2)
Albumin: 4.1 g/dL (ref 3.8–4.8)
Alkaline Phosphatase: 55 IU/L (ref 44–121)
BUN/Creatinine Ratio: 20 (ref 9–23)
BUN: 16 mg/dL (ref 6–24)
Bilirubin Total: 0.3 mg/dL (ref 0.0–1.2)
CO2: 20 mmol/L (ref 20–29)
Calcium: 9.2 mg/dL (ref 8.7–10.2)
Chloride: 106 mmol/L (ref 96–106)
Creatinine, Ser: 0.82 mg/dL (ref 0.57–1.00)
Globulin, Total: 2.4 g/dL (ref 1.5–4.5)
Glucose: 120 mg/dL — ABNORMAL HIGH (ref 70–99)
Potassium: 4.2 mmol/L (ref 3.5–5.2)
Sodium: 140 mmol/L (ref 134–144)
Total Protein: 6.5 g/dL (ref 6.0–8.5)
eGFR: 88 mL/min/{1.73_m2} (ref >59)

## 2021-08-27 LAB — CBC
Hematocrit: 43.2 % (ref 34.0–46.6)
Hemoglobin: 14.7 g/dL (ref 11.1–15.9)
MCH: 32.4 pg (ref 26.6–33.0)
MCHC: 34 g/dL (ref 31.5–35.7)
MCV: 95 fL (ref 79–97)
Platelets: 217 10*3/uL (ref 150–450)
RBC: 4.54 x10E6/uL (ref 3.77–5.28)
RDW: 12.2 % (ref 11.7–15.4)
WBC: 5.9 10*3/uL (ref 3.4–10.8)

## 2021-08-27 LAB — LIPID PANEL
Chol/HDL Ratio: 6.1 ratio — ABNORMAL HIGH (ref 0.0–4.4)
Cholesterol, Total: 232 mg/dL — ABNORMAL HIGH (ref 100–199)
HDL: 38 mg/dL — ABNORMAL LOW (ref >39)
LDL Chol Calc (NIH): 161 mg/dL — ABNORMAL HIGH (ref 0–99)
Triglycerides: 177 mg/dL — ABNORMAL HIGH (ref 0–149)
VLDL Cholesterol Cal: 33 mg/dL (ref 5–40)

## 2021-08-27 NOTE — Telephone Encounter (Signed)
CALLED PATIENT NO ANSWER LEFT VOICEMAIL FOR A CALL BACK °Letter sent °

## 2021-08-30 ENCOUNTER — Other Ambulatory Visit: Payer: Self-pay | Admitting: Primary Care

## 2021-08-30 DIAGNOSIS — G43901 Migraine, unspecified, not intractable, with status migrainosus: Secondary | ICD-10-CM

## 2021-10-07 ENCOUNTER — Ambulatory Visit
Admission: RE | Admit: 2021-10-07 | Discharge: 2021-10-07 | Disposition: A | Discharge status: Home / Self Care | Payer: BC Managed Care – PPO | Source: Ambulatory Visit | Attending: Primary Care | Admitting: Primary Care

## 2021-10-07 DIAGNOSIS — Z1231 Encounter for screening mammogram for malignant neoplasm of breast: Secondary | ICD-10-CM | POA: Insufficient documentation

## 2021-10-08 NOTE — Progress Notes (Signed)
GYNECOLOGY ANNUAL PHYSICAL EXAM PROGRESS NOTE  Subjective:    Whitney DellJennifer L [name] is a 51 y.o. 553P2012 female who presents for an annual exam. The patient has no complaints today. The patient is sexually active. The patient participates in regular exercise: yes. Has the patient ever been transfused or tattooed?: no. The patient reports that there is not domestic violence in her life.   Menstrual History: Menarche age: [age] No LMP recorded. (Menstrual status: IUD).     Gynecologic History:  Contraception: Mirena inserted 05/2016. History of STI's: Denies Last Pap: 04/11/2018. Results were: normal.  Denies h/o abnormal pap smears. Last mammogram: 10/07/2021. Results were: abnormal. Further evaluation is suggested for possible distortion in the left breast.    OB History  Gravida Para Term Preterm AB Living  3 2 2  0 1 2  SAB IAB Ectopic Multiple Live Births  0 1 0 0 0    # Outcome Date GA Lbr Len/2nd Weight Sex Delivery Anes PTL Lv  3 IAB           2 Term           1 Term             Past Medical History:  Diagnosis Date   Anxiety    Depression    Frequent headaches    Hx of migraine headaches    Migraines    Urinary tract infection     Past Surgical History:  Procedure Laterality Date   BREAST BIOPSY Left 03/04/2015   FIBROCYSTIC CHANGES stereo    Family History  Problem Relation Age of Onset   Breast cancer Mother [id number]   Diabetes Father    Breast cancer Maternal Aunt 5461       Lung Cancer   Stomach cancer Maternal Grandmother     Social History   Socioeconomic History   Marital status: Married    Spouse name: Not on file   Number of children: Not on file   Years of education: Not on file   Highest education level: Not on file  Occupational History   Not on file  Tobacco Use   Smoking status: Never   Smokeless tobacco: Never  Vaping Use   Vaping Use: Never used  Substance and Sexual Activity   Alcohol use: No    Alcohol/week: 0.0 standard drinks    Drug use: No   Sexual activity: Yes    Partners: Male    Birth control/protection: Surgical, I.U.D.    Comment: Husband had vasectomy   Other Topics Concern   Not on file  Social History Narrative   Works at [employer] as a Psychologist, forensiclayout clerk   Lives with her husband and 2 children.   Caffeine- 2 cups of tea.   Enjoys spending time with family.   Social Determinants of Health   Financial Resource Strain: Not on file  Food Insecurity: Not on file  Transportation Needs: Not on file  Physical Activity: Not on file  Stress: Not on file  Social Connections: Not on file  Intimate Partner Violence: Not on file    Current Outpatient Medications on File Prior to Visit  Medication Sig Dispense Refill   ALPRAZolam (XANAX) 0.5 MG tablet Take 0.5 mg by mouth as needed.      DULoxetine (CYMBALTA) 60 MG capsule Take 60 mg by mouth daily.     levonorgestrel (MIRENA) 20 MCG/24HR IUD 1 each by Intrauterine route once.     MELATONIN PO Take  1 tablet by mouth at bedtime.     propranolol ER (INDERAL LA) 80 MG 24 hr capsule Take 1 capsule (80 mg total) by mouth daily. For headache prevention. 90 capsule 3   sertraline (ZOLOFT) 100 MG tablet Take 200 mg by mouth daily.     SUMAtriptan (IMITREX) 50 MG tablet TAKE 1 TABLET BY MOUTH AT MIGRAINE ONSET. MAY REPEAT IN 2 HOURS IF HEADACHE SYMPTOMS PERSISTS OR RECURS 10 tablet 0   No current facility-administered medications on file prior to visit.    No Known Allergies   Review of Systems Constitutional: negative for chills, fatigue, fevers and sweats Eyes: negative for irritation, redness and visual disturbance Ears, nose, mouth, throat, and face: negative for hearing loss, nasal congestion, snoring and tinnitus Respiratory: negative for asthma, cough, sputum Cardiovascular: negative for chest pain, dyspnea, exertional chest pressure/discomfort, irregular heart beat, palpitations and syncope Gastrointestinal: negative for abdominal pain,  change in bowel habits, nausea and vomiting Genitourinary: negative for abnormal menstrual periods, genital lesions, sexual problems and vaginal discharge, dysuria and urinary incontinence Integument/breast: negative for breast lump, breast tenderness and nipple discharge Hematologic/lymphatic: negative for bleeding and easy bruising Musculoskeletal:negative for back pain and muscle weakness Neurological: negative for dizziness, headaches, vertigo and weakness Endocrine: negative for diabetic symptoms including polydipsia, polyuria and skin dryness Allergic/Immunologic: negative for hay fever and urticaria      Objective:  Blood pressure 114/82, pulse 60, resp. rate 16, height 5\' 8"  (1.727 m), weight 172 lb 8 oz (78.2 kg).  Body mass index is 26.23 kg/m.    General Appearance:    Alert, cooperative, no distress, appears stated age  Head:    Normocephalic, without obvious abnormality, atraumatic  Eyes:    PERRL, conjunctiva/corneas clear, EOM's intact, both eyes  Ears:    Normal external ear canals, both ears  Nose:   Nares normal, septum midline, mucosa normal, no drainage or sinus tenderness  Throat:   Lips, mucosa, and tongue normal; teeth and gums normal  Neck:   Supple, symmetrical, trachea midline, no adenopathy; thyroid: no enlargement/tenderness/nodules; no carotid bruit or JVD  Back:     Symmetric, no curvature, ROM normal, no CVA tenderness  Lungs:     Clear to auscultation bilaterally, respirations unlabored  Chest Wall:    No tenderness or deformity   Heart:    Regular rate and rhythm, S1 and S2 normal, no murmur, rub or gallop  Breast Exam:    No tenderness, masses, or nipple abnormality  Abdomen:     Soft, non-tender, bowel sounds active all four quadrants, no masses, no organomegaly.    Genitalia:    Pelvic:external genitalia normal, vagina without lesions, discharge, or tenderness, rectovaginal septum  normal. Cervix normal in appearance, no cervical motion tenderness, no  adnexal masses or tenderness.  Uterus normal size, shape, mobile, regular contours, nontender.  Rectal:    Normal external sphincter.  No hemorrhoids appreciated. Internal exam not done.   Extremities:   Extremities normal, atraumatic, no cyanosis or edema  Pulses:   2+ and symmetric all extremities  Skin:   Skin color, texture, turgor normal, no rashes or lesions  Lymph nodes:   Cervical, supraclavicular, and axillary nodes normal  Neurologic:   CNII-XII intact, normal strength, sensation and reflexes throughout   .  Labs:  Lab Results  Component Value Date   WBC 5.9 08/26/2021   HGB 14.7 08/26/2021   HCT 43.2 08/26/2021   MCV 95 08/26/2021   PLT 217 08/26/2021  Lab Results  Component Value Date   CREATININE 0.82 08/26/2021   BUN 16 08/26/2021   NA 140 08/26/2021   K 4.2 08/26/2021   CL 106 08/26/2021   CO2 20 08/26/2021    Lab Results  Component Value Date   ALT 20 08/26/2021   AST 24 08/26/2021   ALKPHOS 55 08/26/2021   BILITOT 0.3 08/26/2021    Lab Results  Component Value Date   TSH 1.62 05/28/2019     Assessment:   1. Encounter for well woman exam with routine gynecological exam   2. Family history of breast cancer in mother   3. Dyslipidemia   4. Abnormal mammogram   5. Menorrhagia with regular cycle      Plan:  Blood tests: Labs utd, done by PCP. Breast self exam technique reviewed and patient encouraged to perform self-exam monthly. Screening mammogram abnormal, is scheduled for f/u in 2 weeks.  Contraception: IUD. Due for removal in 3 years.  Currently is managing her menorrhagia.  Discussed healthy lifestyle modifications. Dyslipidemia managed by PCP.  Pap smear, will defer to next year, performing q 5 year screens. No prior h/o cervical dysplasia.  COVID vaccination status: up to date.  Follow up in 1 year for annual exam   Hildred Laser, MD [facility]

## 2021-10-08 NOTE — Patient Instructions (Addendum)
Preventive Care 40-50 Years Old, Female Preventive care refers to lifestyle choices and visits with your health care provider that can promote health and wellness. Preventive care visits are also called wellness exams. What can I expect for my preventive care visit? Counseling Your health care provider may ask you questions about your: Medical history, including: Past medical problems. Family medical history. Pregnancy history. Current health, including: Menstrual cycle. Method of birth control. Emotional well-being. Home life and relationship well-being. Sexual activity and sexual health. Lifestyle, including: Alcohol, nicotine or tobacco, and drug use. Access to firearms. Diet, exercise, and sleep habits. Work and work environment. Sunscreen use. Safety issues such as seatbelt and bike helmet use. Physical exam Your health care provider will check your: Height and weight. These may be used to calculate your BMI (body mass index). BMI is a measurement that tells if you are at a healthy weight. Waist circumference. This measures the distance around your waistline. This measurement also tells if you are at a healthy weight and may help predict your risk of certain diseases, such as type 2 diabetes and high blood pressure. Heart rate and blood pressure. Body temperature. Skin for abnormal spots. What immunizations do I need?  Vaccines are usually given at various ages, according to a schedule. Your health care provider will recommend vaccines for you based on your age, medical history, and lifestyle or other factors, such as travel or where you work. What tests do I need? Screening Your health care provider may recommend screening tests for certain conditions. This may include: Lipid and cholesterol levels. Diabetes screening. This is done by checking your blood sugar (glucose) after you have not eaten for a while (fasting). Pelvic exam and Pap test. Hepatitis B test. Hepatitis C  test. HIV (human immunodeficiency virus) test. STI (sexually transmitted infection) testing, if you are at risk. Lung cancer screening. Colorectal cancer screening. Mammogram. Talk with your health care provider about when you should start having regular mammograms. This may depend on whether you have a family history of breast cancer. BRCA-related cancer screening. This may be done if you have a family history of breast, ovarian, tubal, or peritoneal cancers. Bone density scan. This is done to screen for osteoporosis. Talk with your health care provider about your test results, treatment options, and if necessary, the need for more tests. Follow these instructions at home: Eating and drinking  Eat a diet that includes fresh fruits and vegetables, whole grains, lean protein, and low-fat dairy products. Take vitamin and mineral supplements as recommended by your health care provider. Do not drink alcohol if: Your health care provider tells you not to drink. You are pregnant, may be pregnant, or are planning to become pregnant. If you drink alcohol: Limit how much you have to 0-1 drink a day. Know how much alcohol is in your drink. In the [place] one drink equals one 12 oz bottle of beer (355 mL), one 5 oz glass of wine (148 mL), or one 1 oz glass of hard liquor (44 mL). Lifestyle Brush your teeth every morning and night with fluoride toothpaste. Floss one time each day. Exercise for at least 30 minutes 5 or more days each week. Do not use any products that contain nicotine or tobacco. These products include cigarettes, chewing tobacco, and vaping devices, such as e-cigarettes. If you need help quitting, ask your health care provider. Do not use drugs. If you are sexually active, practice safe sex. Use a condom or other form of protection to   prevent STIs. If you do not wish to become pregnant, use a form of birth control. If you plan to become pregnant, see your health care provider for a  prepregnancy visit. Take aspirin only as told by your health care provider. Make sure that you understand how much to take and what form to take. Work with your health care provider to find out whether it is safe and beneficial for you to take aspirin daily. Find healthy ways to manage stress, such as: Meditation, yoga, or listening to music. Journaling. Talking to a trusted person. Spending time with friends and family. Minimize exposure to UV radiation to reduce your risk of skin cancer. Safety Always wear your seat belt while driving or riding in a vehicle. Do not drive: If you have been drinking alcohol. Do not ride with someone who has been drinking. When you are tired or distracted. While texting. If you have been using any mind-altering substances or drugs. Wear a helmet and other protective equipment during sports activities. If you have firearms in your house, make sure you follow all gun safety procedures. Seek help if you have been physically or sexually abused. What's next? Visit your health care provider once a year for an annual wellness visit. Ask your health care provider how often you should have your eyes and teeth checked. Stay up to date on all vaccines. This information is not intended to replace advice given to you by your health care provider. Make sure you discuss any questions you have with your health care provider. Document Revised: 10/22/2020 Document Reviewed: 10/22/2020 Elsevier Patient Education  [place].

## 2021-10-09 ENCOUNTER — Other Ambulatory Visit: Payer: Self-pay | Admitting: Primary Care

## 2021-10-09 DIAGNOSIS — R928 Other abnormal and inconclusive findings on diagnostic imaging of breast: Secondary | ICD-10-CM

## 2021-10-13 ENCOUNTER — Ambulatory Visit (INDEPENDENT_AMBULATORY_CARE_PROVIDER_SITE_OTHER): Payer: BC Managed Care – PPO | Admitting: Obstetrics and Gynecology

## 2021-10-13 ENCOUNTER — Encounter: Payer: Self-pay | Admitting: Obstetrics and Gynecology

## 2021-10-13 VITALS — BP 114/82 | HR 60 | Resp 16 | Ht 68.0 in | Wt 172.5 lb

## 2021-10-13 DIAGNOSIS — Z01411 Encounter for gynecological examination (general) (routine) with abnormal findings: Secondary | ICD-10-CM | POA: Diagnosis not present

## 2021-10-13 DIAGNOSIS — N920 Excessive and frequent menstruation with regular cycle: Secondary | ICD-10-CM

## 2021-10-13 DIAGNOSIS — Z01419 Encounter for gynecological examination (general) (routine) without abnormal findings: Secondary | ICD-10-CM

## 2021-10-13 DIAGNOSIS — Z803 Family history of malignant neoplasm of breast: Secondary | ICD-10-CM

## 2021-10-13 DIAGNOSIS — R928 Other abnormal and inconclusive findings on diagnostic imaging of breast: Secondary | ICD-10-CM | POA: Diagnosis not present

## 2021-10-13 DIAGNOSIS — Z124 Encounter for screening for malignant neoplasm of cervix: Secondary | ICD-10-CM

## 2021-10-13 DIAGNOSIS — E785 Hyperlipidemia, unspecified: Secondary | ICD-10-CM

## 2021-10-14 DIAGNOSIS — F411 Generalized anxiety disorder: Secondary | ICD-10-CM | POA: Diagnosis not present

## 2021-10-14 DIAGNOSIS — G4700 Insomnia, unspecified: Secondary | ICD-10-CM | POA: Diagnosis not present

## 2021-10-14 DIAGNOSIS — F330 Major depressive disorder, recurrent, mild: Secondary | ICD-10-CM | POA: Diagnosis not present

## 2021-11-02 ENCOUNTER — Ambulatory Visit
Admission: RE | Admit: 2021-11-02 | Discharge: 2021-11-02 | Disposition: A | Discharge status: Home / Self Care | Payer: BC Managed Care – PPO | Source: Ambulatory Visit | Attending: Primary Care | Admitting: Primary Care

## 2021-11-02 DIAGNOSIS — R928 Other abnormal and inconclusive findings on diagnostic imaging of breast: Secondary | ICD-10-CM | POA: Insufficient documentation

## 2021-11-02 DIAGNOSIS — R922 Inconclusive mammogram: Secondary | ICD-10-CM | POA: Diagnosis not present

## 2021-11-28 ENCOUNTER — Other Ambulatory Visit: Payer: Self-pay | Admitting: Family Medicine

## 2021-11-28 DIAGNOSIS — G43901 Migraine, unspecified, not intractable, with status migrainosus: Secondary | ICD-10-CM

## 2021-11-29 DIAGNOSIS — H6692 Otitis media, unspecified, left ear: Secondary | ICD-10-CM | POA: Diagnosis not present

## 2021-11-29 DIAGNOSIS — R070 Pain in throat: Secondary | ICD-10-CM | POA: Diagnosis not present

## 2022-03-14 DIAGNOSIS — G43901 Migraine, unspecified, not intractable, with status migrainosus: Secondary | ICD-10-CM

## 2022-03-15 MED ORDER — SUMATRIPTAN SUCCINATE 50 MG PO TABS: ORAL_TABLET | ORAL | 0 refills | Status: DC

## 2022-06-07 ENCOUNTER — Other Ambulatory Visit: Payer: Self-pay | Admitting: Primary Care

## 2022-06-07 DIAGNOSIS — G43901 Migraine, unspecified, not intractable, with status migrainosus: Secondary | ICD-10-CM

## 2022-06-07 MED ORDER — SUMATRIPTAN SUCCINATE 50 MG PO TABS: ORAL_TABLET | ORAL | 0 refills | Status: DC

## 2022-06-07 NOTE — Telephone Encounter (Signed)
Patient is due for follow up in April. Please schedule, thank you!

## 2022-06-07 NOTE — Telephone Encounter (Signed)
From: Haze Justin To: Office of Pleas Koch, NP Sent: 06/07/2022 8:21 AM EST Subject: Medication Renewal Request  Refills have been requested for the following medications:   SUMAtriptan (IMITREX) 50 MG tablet [Dresean Beckel K Rowena Moilanen]  Preferred pharmacy: [facility] [place] ([place]), [place] - [address] Delivery method: [employer]

## 2022-06-08 DIAGNOSIS — G4700 Insomnia, unspecified: Secondary | ICD-10-CM | POA: Diagnosis not present

## 2022-06-08 DIAGNOSIS — F330 Major depressive disorder, recurrent, mild: Secondary | ICD-10-CM | POA: Diagnosis not present

## 2022-06-08 DIAGNOSIS — F411 Generalized anxiety disorder: Secondary | ICD-10-CM | POA: Diagnosis not present

## 2022-06-08 NOTE — Telephone Encounter (Signed)
Spoke to pt, scheduled f/u for 08/17/22

## 2022-07-26 ENCOUNTER — Other Ambulatory Visit: Payer: Self-pay | Admitting: Primary Care

## 2022-07-26 DIAGNOSIS — R519 Headache, unspecified: Secondary | ICD-10-CM

## 2022-08-17 ENCOUNTER — Encounter: Payer: Self-pay | Admitting: Primary Care

## 2022-08-17 ENCOUNTER — Other Ambulatory Visit: Payer: BC Managed Care – PPO

## 2022-08-17 ENCOUNTER — Ambulatory Visit: Payer: BC Managed Care – PPO | Admitting: Primary Care

## 2022-08-17 VITALS — BP 118/80 | HR 76 | Temp 97.6°F | Ht 68.0 in | Wt 175.0 lb

## 2022-08-17 DIAGNOSIS — F418 Other specified anxiety disorders: Secondary | ICD-10-CM

## 2022-08-17 DIAGNOSIS — E785 Hyperlipidemia, unspecified: Secondary | ICD-10-CM | POA: Diagnosis not present

## 2022-08-17 DIAGNOSIS — Z1231 Encounter for screening mammogram for malignant neoplasm of breast: Secondary | ICD-10-CM

## 2022-08-17 DIAGNOSIS — R519 Headache, unspecified: Secondary | ICD-10-CM

## 2022-08-17 DIAGNOSIS — G43009 Migraine without aura, not intractable, without status migrainosus: Secondary | ICD-10-CM

## 2022-08-17 DIAGNOSIS — Z1211 Encounter for screening for malignant neoplasm of colon: Secondary | ICD-10-CM

## 2022-08-17 LAB — COMPREHENSIVE METABOLIC PANEL
ALT: 21 U/L (ref 0–35)
AST: 16 U/L (ref 0–37)
Albumin: 4.3 g/dL (ref 3.5–5.2)
Alkaline Phosphatase: 55 U/L (ref 39–117)
BUN: 16 mg/dL (ref 6–23)
CO2: 28 mEq/L (ref 19–32)
Calcium: 9 mg/dL (ref 8.4–10.5)
Chloride: 104 mEq/L (ref 96–112)
Creatinine, Ser: 0.81 mg/dL (ref 0.40–1.20)
GFR: 84.41 mL/min (ref >60.00)
Glucose, Bld: 121 mg/dL — ABNORMAL HIGH (ref 70–99)
Potassium: 4.4 mEq/L (ref 3.5–5.1)
Sodium: 139 mEq/L (ref 135–145)
Total Bilirubin: 0.5 mg/dL (ref 0.2–1.2)
Total Protein: 6.7 g/dL (ref 6.0–8.3)

## 2022-08-17 LAB — LIPID PANEL
Cholesterol: 257 mg/dL — ABNORMAL HIGH (ref 0–200)
HDL: 46.6 mg/dL (ref >39.00)
LDL Cholesterol: 185 mg/dL — ABNORMAL HIGH (ref 0–99)
NonHDL: 210.56
Total CHOL/HDL Ratio: 6
Triglycerides: 126 mg/dL (ref 0.0–149.0)
VLDL: 25.2 mg/dL (ref 0.0–40.0)

## 2022-08-17 LAB — HEMOGLOBIN A1C: Hgb A1c MFr Bld: 5.6 % (ref 4.6–6.5)

## 2022-08-17 MED ORDER — SUMATRIPTAN SUCCINATE 100 MG PO TABS: ORAL_TABLET | ORAL | 0 refills | Status: DC

## 2022-08-17 NOTE — Assessment & Plan Note (Signed)
Repeat lipid panel pending.  Discussed the importance of a healthy diet and regular exercise in order for weight loss, and to reduce the risk of further co-morbidity.  

## 2022-08-17 NOTE — Assessment & Plan Note (Signed)
Controlled.  Continue propranolol ER 80 mg daily. Increase sumatriptan to 100 mg PRN for migraines.

## 2022-08-17 NOTE — Progress Notes (Signed)
Subjective:    Patient ID: Whitney [name], female    DOB: January 14, 1971, 52 y.o.   MRN: [id number]  HPI  Whitney [name] is a very pleasant 52 y.o. female with a history of migraines, depression with anxiety, constipation, iron deficiency anemia, hyperlipidemia who presents today for follow up of chronic conditions.  1) Depression with Anxiety: Following with psychiatry and is managed on duloxetine 60 mg daily, sertraline 100 mg daily, and alprazolam 0.5 mg PRN.  Overall, she doesn't feel well managed, has discussed with her psychiatrist. She has a follow up scheduled with in 2 weeks. She's been under a lot of stress with her aging father.   2) Frequent Headaches/Migraines: Currently managed on propranolol ER 80 mg daily for prevention of headaches and sumatriptan 50 mg PRN.  Overall, feels well managed on this regimen except for over the last few months. She's found that she's had to take two of the 50 mg dose tablets of sumatriptan for each migraine. She continues with migraines once monthly on average. Overall headaches are controlled on propranolol.   BP Readings from Last 3 Encounters:  08/17/22 118/80  10/13/21 114/82  08/26/21 108/62    Wt Readings from Last 3 Encounters:  08/17/22 175 lb (79.4 kg)  10/13/21 172 lb 8 oz (78.2 kg)  08/26/21 173 lb (78.5 kg)      Review of Systems  Respiratory:  Negative for shortness of breath.   Cardiovascular:  Negative for chest pain.  Gastrointestinal:  Positive for constipation.  Neurological:  Positive for headaches. Negative for dizziness.  Psychiatric/Behavioral:  The patient is nervous/anxious.          Past Medical History:  Diagnosis Date   Anxiety    Depression    Frequent headaches    Hx of migraine headaches    Migraines    Urinary tract infection     Social History   Socioeconomic History   Marital status: Married    Spouse name: Not on file   Number of children: Not on file   Years of education: Not  on file   Highest education level: Not on file  Occupational History   Not on file  Tobacco Use   Smoking status: Never   Smokeless tobacco: Never  Vaping Use   Vaping Use: Never used  Substance and Sexual Activity   Alcohol use: No    Alcohol/week: 0.0 standard drinks of alcohol   Drug use: No   Sexual activity: Yes    Partners: Male    Birth control/protection: Surgical, I.U.D.    Comment: Husband had vasectomy   Other Topics Concern   Not on file  Social History Narrative   Works at [employer] as a Psychologist, forensic   Lives with her husband and 2 children.   Caffeine- 2 cups of tea.   Enjoys spending time with family.   Social Determinants of Health   Financial Resource Strain: Not on file  Food Insecurity: Not on file  Transportation Needs: Not on file  Physical Activity: Not on file  Stress: Not on file  Social Connections: Not on file  Intimate Partner Violence: Not on file    Past Surgical History:  Procedure Laterality Date   BREAST BIOPSY Left 03/04/2015   FIBROCYSTIC CHANGES stereo    Family History  Problem Relation Age of Onset   Breast cancer Mother 31   Diabetes Father    Breast cancer Maternal Aunt 25  Lung Cancer   Stomach cancer Maternal Grandmother     No Known Allergies  Current Outpatient Medications on File Prior to Visit  Medication Sig Dispense Refill   ALPRAZolam (XANAX) 0.5 MG tablet Take 0.5 mg by mouth as needed.      DULoxetine (CYMBALTA) 60 MG capsule Take 60 mg by mouth daily.     levonorgestrel (MIRENA) 20 MCG/24HR IUD 1 each by Intrauterine route once.     MELATONIN PO Take 1 tablet by mouth at bedtime.     propranolol ER (INDERAL LA) 80 MG 24 hr capsule TAKE 1 CAPSULE BY MOUTH ONCE DAILY FOR HEADACHE PREVENTION 90 capsule 0   sertraline (ZOLOFT) 100 MG tablet Take 200 mg by mouth daily.     No current facility-administered medications on file prior to visit.    BP 118/80   Pulse 76   Temp 97.6 F (36.4 C)  (Temporal)   Ht 5\' 8"  (1.727 m)   Wt 175 lb (79.4 kg)   SpO2 98%   BMI 26.61 kg/m  Objective:   Physical Exam Cardiovascular:     Rate and Rhythm: Normal rate and regular rhythm.  Pulmonary:     Effort: Pulmonary effort is normal.     Breath sounds: Normal breath sounds.  Musculoskeletal:     Cervical back: Neck supple.  Skin:    General: Skin is warm and dry.  Neurological:     Mental Status: She is alert.  Psychiatric:        Mood and Affect: Mood normal.           Assessment & Plan:  Migraine without aura and without status migrainosus, not intractable Assessment & Plan: Deteriorated. Likely a combination of weather and stress.  Increase sumatriptan to 100 mg PRN. Continue propranolol ER 80 mg daily.  Continue to monitor.   Orders: -     SUMAtriptan Succinate; Take 1 tablet by mouth at migraine onset. May repeat in 2 hours if headache persists or recurs.  Dispense: 10 tablet; Refill: 0  Screening mammogram for breast cancer -     3D Screening Mammogram, Left and Right; Future  Screening for colon cancer -     Ambulatory referral to Gastroenterology  Depression with anxiety Assessment & Plan: Deteriorated.  Following with psychiatry. Continue Zoloft 100 mg daily, Cymbalta 60 mg daily, Xanax 0.5 mg PRN.   Frequent headaches Assessment & Plan: Controlled.  Continue propranolol ER 80 mg daily. Increase sumatriptan to 100 mg PRN for migraines.    Hyperlipidemia, unspecified hyperlipidemia type Assessment & Plan: Repeat lipid panel pending.  Discussed the importance of a healthy diet and regular exercise in order for weight loss, and to reduce the risk of further co-morbidity.   Orders: -     Lipid panel -     Comprehensive metabolic panel -     Hemoglobin A1c        Doreene Nest, NP

## 2022-08-17 NOTE — Addendum Note (Signed)
Addended by: Alvina Chou on: 08/17/2022 02:29 PM   Modules accepted: Orders

## 2022-08-17 NOTE — Assessment & Plan Note (Signed)
Deteriorated. Likely a combination of weather and stress.  Increase sumatriptan to 100 mg PRN. Continue propranolol ER 80 mg daily.  Continue to monitor.

## 2022-08-17 NOTE — Assessment & Plan Note (Signed)
Deteriorated.  Following with psychiatry. Continue Zoloft 100 mg daily, Cymbalta 60 mg daily, Xanax 0.5 mg PRN.

## 2022-08-17 NOTE — Patient Instructions (Signed)
Stop by the lab prior to leaving today. I will notify you of your results once received.   [facility].   Follow up with your psychiatrist as scheduled.  You will either be contacted via phone regarding your referral to GI for the colonoscopy, or you may receive a letter on your MyChart portal from our referral team with instructions for scheduling an appointment. Please let us know if you have not been contacted by anyone within two weeks.  It was a pleasure to see you today!

## 2022-08-23 LAB — LIPOPROTEIN A (LPA): Lipoprotein (a): 36 nmol/L (ref <75)

## 2022-08-25 ENCOUNTER — Encounter: Payer: Self-pay | Admitting: *Deleted

## 2022-08-31 DIAGNOSIS — F330 Major depressive disorder, recurrent, mild: Secondary | ICD-10-CM | POA: Diagnosis not present

## 2022-08-31 DIAGNOSIS — F411 Generalized anxiety disorder: Secondary | ICD-10-CM | POA: Diagnosis not present

## 2022-08-31 DIAGNOSIS — G4700 Insomnia, unspecified: Secondary | ICD-10-CM | POA: Diagnosis not present

## 2022-10-05 DIAGNOSIS — G4700 Insomnia, unspecified: Secondary | ICD-10-CM | POA: Diagnosis not present

## 2022-10-05 DIAGNOSIS — F411 Generalized anxiety disorder: Secondary | ICD-10-CM | POA: Diagnosis not present

## 2022-10-05 DIAGNOSIS — F330 Major depressive disorder, recurrent, mild: Secondary | ICD-10-CM | POA: Diagnosis not present

## 2022-10-08 ENCOUNTER — Telehealth: Payer: Self-pay | Admitting: Obstetrics and Gynecology

## 2022-10-08 NOTE — Telephone Encounter (Signed)
The patient was scheduled for 6/11 at 8:15 am with Dr. Valentino Saxon, there was a change in the schedule where we had to rescheduled. The patient is aware and advised we can offer another appointment with another provider or call when opening with Dr. Oretha Milch schedule comes opened. The patient is requesting a call back with openings come available with Dr. Valentino Saxon

## 2022-10-18 ENCOUNTER — Other Ambulatory Visit: Payer: Self-pay | Admitting: Primary Care

## 2022-10-18 DIAGNOSIS — Z1231 Encounter for screening mammogram for malignant neoplasm of breast: Secondary | ICD-10-CM

## 2022-10-19 ENCOUNTER — Encounter: Payer: BC Managed Care – PPO | Admitting: Obstetrics and Gynecology

## 2022-10-20 ENCOUNTER — Ambulatory Visit: Payer: BC Managed Care – PPO | Admitting: Primary Care

## 2022-10-20 NOTE — Telephone Encounter (Signed)
I contacted the patient via phone. I left generic message for the patient to call back to confirm scheduled appointment for Tues, 7/30 at 9:15 am with Dr. Valentino Saxon.

## 2022-10-21 ENCOUNTER — Other Ambulatory Visit: Payer: Self-pay | Admitting: Primary Care

## 2022-10-21 DIAGNOSIS — R519 Headache, unspecified: Secondary | ICD-10-CM

## 2022-10-23 ENCOUNTER — Other Ambulatory Visit: Payer: Self-pay | Admitting: Primary Care

## 2022-10-23 DIAGNOSIS — R519 Headache, unspecified: Secondary | ICD-10-CM

## 2022-10-23 NOTE — Telephone Encounter (Signed)
From: [name] To: Office of Doreene Nest, NP Sent: 10/21/2022 5:06 PM EDT Subject: Medication Renewal Request  Refills have been requested for the following medications:   propranolol ER (INDERAL LA) 80 MG 24 hr capsule [[name]]  Patient Comment: Can this be called in ASAP? I'm leaving town tomorrow and didn't realize that I was going to run out.  Preferred pharmacy: [id number] - [place] ([place]), [place] - [address] Delivery method: [employer]

## 2022-11-02 ENCOUNTER — Ambulatory Visit
Admission: RE | Admit: 2022-11-02 | Discharge: 2022-11-02 | Disposition: A | Discharge status: Home / Self Care | Payer: BC Managed Care – PPO | Source: Ambulatory Visit | Attending: Primary Care | Admitting: Primary Care

## 2022-11-02 DIAGNOSIS — Z1231 Encounter for screening mammogram for malignant neoplasm of breast: Secondary | ICD-10-CM | POA: Diagnosis not present

## 2022-11-29 ENCOUNTER — Other Ambulatory Visit: Payer: Self-pay | Admitting: Primary Care

## 2022-12-06 NOTE — Progress Notes (Unsigned)
GYNECOLOGY ANNUAL PHYSICAL EXAM PROGRESS NOTE  Subjective:    Whitney [name] is a 52 y.o. G44P2012 female who presents for an annual exam. The patient has no complaints today. The patient {is/is not/has never been:13135} sexually active. The patient participates in regular exercise: {yes/no/not asked:9010}. Has the patient ever been transfused or tattooed?: {yes/no/not asked:9010}. The patient reports that there {is/is not:9024} domestic violence in her life.    Menstrual History: Menarche age: *** No LMP recorded. (Menstrual status: IUD).     Gynecologic History:  Contraception: {method:5051} History of STI's:  Last Pap: ***. Results were: {norm/abn:16337}.  ***Denies/Notes h/o abnormal pap smears. Last mammogram: ***. Results were: {norm/abn:16337}       OB History  Gravida Para Term Preterm AB Living  3 2 2  0 1 2  SAB IAB Ectopic Multiple Live Births  0 1 0 0 0    # Outcome Date GA Lbr Len/2nd Weight Sex Type Anes PTL Lv  3 IAB           2 Term           1 Term             Past Medical History:  Diagnosis Date   Anxiety    Depression    Frequent headaches    Hx of migraine headaches    Migraines    Urinary tract infection     Past Surgical History:  Procedure Laterality Date   BREAST BIOPSY Left 03/04/2015   FIBROCYSTIC CHANGES stereo    Family History  Problem Relation Age of Onset   Breast cancer Mother 36   Diabetes Father    Breast cancer Maternal Aunt 54       Lung Cancer   Stomach cancer Maternal Grandmother     Social History   Socioeconomic History   Marital status: Married    Spouse name: Not on file   Number of children: Not on file   Years of education: Not on file   Highest education level: Not on file  Occupational History   Not on file  Tobacco Use   Smoking status: Never   Smokeless tobacco: Never  Vaping Use   Vaping status: Never Used  Substance and Sexual Activity   Alcohol use: No    Alcohol/week: 0.0  standard drinks of alcohol   Drug use: No   Sexual activity: Yes    Partners: Male    Birth control/protection: Surgical, I.U.D.    Comment: Husband had vasectomy   Other Topics Concern   Not on file  Social History Narrative   Works at [employer] as a Psychologist, forensic   Lives with her husband and 2 children.   Caffeine- 2 cups of tea.   Enjoys spending time with family.   Social Determinants of Health   Financial Resource Strain: Not on file  Food Insecurity: Not on file  Transportation Needs: Not on file  Physical Activity: Not on file  Stress: Not on file  Social Connections: Not on file  Intimate Partner Violence: Not on file    Current Outpatient Medications on File Prior to Visit  Medication Sig Dispense Refill   ALPRAZolam (XANAX) 0.5 MG tablet Take 0.5 mg by mouth as needed.      DULoxetine (CYMBALTA) 60 MG capsule Take 60 mg by mouth daily.     levonorgestrel (MIRENA) 20 MCG/24HR IUD 1 each by Intrauterine route once.     MELATONIN PO Take 1 tablet  by mouth at bedtime.     propranolol ER (INDERAL LA) 80 MG 24 hr capsule TAKE 1 CAPSULE BY MOUTH ONCE DAILY FOR HEADACHE PREVENTION 90 capsule 2   sertraline (ZOLOFT) 100 MG tablet Take 200 mg by mouth daily.     SUMAtriptan (IMITREX) 100 MG tablet Take 1 tablet by mouth at migraine onset. May repeat in 2 hours if headache persists or recurs. 10 tablet 0   No current facility-administered medications on file prior to visit.    No Known Allergies   Review of Systems Constitutional: negative for chills, fatigue, fevers and sweats Eyes: negative for irritation, redness and visual disturbance Ears, nose, mouth, throat, and face: negative for hearing loss, nasal congestion, snoring and tinnitus Respiratory: negative for asthma, cough, sputum Cardiovascular: negative for chest pain, dyspnea, exertional chest pressure/discomfort, irregular heart beat, palpitations and syncope Gastrointestinal: negative for abdominal  pain, change in bowel habits, nausea and vomiting Genitourinary: negative for abnormal menstrual periods, genital lesions, sexual problems and vaginal discharge, dysuria and urinary incontinence Integument/breast: negative for breast lump, breast tenderness and nipple discharge Hematologic/lymphatic: negative for bleeding and easy bruising Musculoskeletal:negative for back pain and muscle weakness Neurological: negative for dizziness, headaches, vertigo and weakness Endocrine: negative for diabetic symptoms including polydipsia, polyuria and skin dryness Allergic/Immunologic: negative for hay fever and urticaria      Objective:  There were no vitals taken for this visit. There is no height or weight on file to calculate BMI.    General Appearance:    Alert, cooperative, no distress, appears stated age  Head:    Normocephalic, without obvious abnormality, atraumatic  Eyes:    PERRL, conjunctiva/corneas clear, EOM's intact, both eyes  Ears:    Normal external ear canals, both ears  Nose:   Nares normal, septum midline, mucosa normal, no drainage or sinus tenderness  Throat:   Lips, mucosa, and tongue normal; teeth and gums normal  Neck:   Supple, symmetrical, trachea midline, no adenopathy; thyroid: no enlargement/tenderness/nodules; no carotid bruit or JVD  Back:     Symmetric, no curvature, ROM normal, no CVA tenderness  Lungs:     Clear to auscultation bilaterally, respirations unlabored  Chest Wall:    No tenderness or deformity   Heart:    Regular rate and rhythm, S1 and S2 normal, no murmur, rub or gallop  Breast Exam:    No tenderness, masses, or nipple abnormality  Abdomen:     Soft, non-tender, bowel sounds active all four quadrants, no masses, no organomegaly.    Genitalia:    Pelvic:external genitalia normal, vagina without lesions, discharge, or tenderness, rectovaginal septum  normal. Cervix normal in appearance, no cervical motion tenderness, no adnexal masses or tenderness.   Uterus normal size, shape, mobile, regular contours, nontender.  Rectal:    Normal external sphincter.  No hemorrhoids appreciated. Internal exam not done.   Extremities:   Extremities normal, atraumatic, no cyanosis or edema  Pulses:   2+ and symmetric all extremities  Skin:   Skin color, texture, turgor normal, no rashes or lesions  Lymph nodes:   Cervical, supraclavicular, and axillary nodes normal  Neurologic:   CNII-XII intact, normal strength, sensation and reflexes throughout   .  Labs:  Lab Results  Component Value Date   WBC 5.9 08/26/2021   HGB 14.7 08/26/2021   HCT 43.2 08/26/2021   MCV 95 08/26/2021   PLT 217 08/26/2021    Lab Results  Component Value Date   CREATININE 0.81 08/17/2022  BUN 16 08/17/2022   NA 139 08/17/2022   K 4.4 08/17/2022   CL 104 08/17/2022   CO2 28 08/17/2022    Lab Results  Component Value Date   ALT 21 08/17/2022   AST 16 08/17/2022   ALKPHOS 55 08/17/2022   BILITOT 0.5 08/17/2022    Lab Results  Component Value Date   TSH 1.62 05/28/2019     Assessment:   No diagnosis found.   Plan:  Blood tests: {blood tests:13147}. Breast self exam technique reviewed and patient encouraged to perform self-exam monthly. Contraception: {contraceptive methods:5051}. Discussed healthy lifestyle modifications. Mammogram {discussed/ordered:14545} Pap smear {discussed/ordered:14545}. COVID vaccination status: Follow up in 1 year for annual exam   Tommie Raymond, CMA [facility]

## 2022-12-06 NOTE — Patient Instructions (Incomplete)
Preventive Care 40-51 Years Old, Female Preventive care refers to lifestyle choices and visits with your health care provider that can promote health and wellness. Preventive care visits are also called wellness exams. What can I expect for my preventive care visit? Counseling Your health care provider may ask you questions about your: Medical history, including: Past medical problems. Family medical history. Pregnancy history. Current health, including: Menstrual cycle. Method of birth control. Emotional well-being. Home life and relationship well-being. Sexual activity and sexual health. Lifestyle, including: Alcohol, nicotine or tobacco, and drug use. Access to firearms. Diet, exercise, and sleep habits. Work and work environment. Sunscreen use. Safety issues such as seatbelt and bike helmet use. Physical exam Your health care provider will check your: Height and weight. These may be used to calculate your BMI (body mass index). BMI is a measurement that tells if you are at a healthy weight. Waist circumference. This measures the distance around your waistline. This measurement also tells if you are at a healthy weight and may help predict your risk of certain diseases, such as type 2 diabetes and high blood pressure. Heart rate and blood pressure. Body temperature. Skin for abnormal spots. What immunizations do I need?  Vaccines are usually given at various ages, according to a schedule. Your health care provider will recommend vaccines for you based on your age, medical history, and lifestyle or other factors, such as travel or where you work. What tests do I need? Screening Your health care provider may recommend screening tests for certain conditions. This may include: Lipid and cholesterol levels. Diabetes screening. This is done by checking your blood sugar (glucose) after you have not eaten for a while (fasting). Pelvic exam and Pap test. Hepatitis B test. Hepatitis C  test. HIV (human immunodeficiency virus) test. STI (sexually transmitted infection) testing, if you are at risk. Lung cancer screening. Colorectal cancer screening. Mammogram. Talk with your health care provider about when you should start having regular mammograms. This may depend on whether you have a family history of breast cancer. BRCA-related cancer screening. This may be done if you have a family history of breast, ovarian, tubal, or peritoneal cancers. Bone density scan. This is done to screen for osteoporosis. Talk with your health care provider about your test results, treatment options, and if necessary, the need for more tests. Follow these instructions at home: Eating and drinking  Eat a diet that includes fresh fruits and vegetables, whole grains, lean protein, and low-fat dairy products. Take vitamin and mineral supplements as recommended by your health care provider. Do not drink alcohol if: Your health care provider tells you not to drink. You are pregnant, may be pregnant, or are planning to become pregnant. If you drink alcohol: Limit how much you have to 0-1 drink a day. Know how much alcohol is in your drink. In the [place] one drink equals one 12 oz bottle of beer (355 mL), one 5 oz glass of wine (148 mL), or one 1 oz glass of hard liquor (44 mL). Lifestyle Brush your teeth every morning and night with fluoride toothpaste. Floss one time each day. Exercise for at least 30 minutes 5 or more days each week. Do not use any products that contain nicotine or tobacco. These products include cigarettes, chewing tobacco, and vaping devices, such as e-cigarettes. If you need help quitting, ask your health care provider. Do not use drugs. If you are sexually active, practice safe sex. Use a condom or other form of protection to   prevent STIs. If you do not wish to become pregnant, use a form of birth control. If you plan to become pregnant, see your health care provider for a  prepregnancy visit. Take aspirin only as told by your health care provider. Make sure that you understand how much to take and what form to take. Work with your health care provider to find out whether it is safe and beneficial for you to take aspirin daily. Find healthy ways to manage stress, such as: Meditation, yoga, or listening to music. Journaling. Talking to a trusted person. Spending time with friends and family. Minimize exposure to UV radiation to reduce your risk of skin cancer. Safety Always wear your seat belt while driving or riding in a vehicle. Do not drive: If you have been drinking alcohol. Do not ride with someone who has been drinking. When you are tired or distracted. While texting. If you have been using any mind-altering substances or drugs. Wear a helmet and other protective equipment during sports activities. If you have firearms in your house, make sure you follow all gun safety procedures. Seek help if you have been physically or sexually abused. What's next? Visit your health care provider once a year for an annual wellness visit. Ask your health care provider how often you should have your eyes and teeth checked. Stay up to date on all vaccines. This information is not intended to replace advice given to you by your health care provider. Make sure you discuss any questions you have with your health care provider. Document Revised: 10/22/2020 Document Reviewed: 10/22/2020 Elsevier Patient Education  2024 Else[employer]. Breast Self-Awareness Breast self-awareness is knowing how your breasts look and feel. You need to: Check your breasts on a regular basis. Tell your doctor about any changes. Become familiar with the look and feel of your breasts. This can help you catch a breast problem while it is still small and can be treated. You should do breast self-exams even if you have breast implants. What you need: A mirror. A well-lit room. A pillow or other  soft object. How to do a breast self-exam Follow these steps to do a breast self-exam: Look for changes  Take off all the clothes above your waist. Stand in front of a mirror in a room with good lighting. Put your hands down at your sides. Compare your breasts in the mirror. Look for any difference between them, such as: A difference in shape. A difference in size. Wrinkles, dips, and bumps in one breast and not the other. Look at each breast for changes in the skin, such as: Redness. Scaly areas. Skin that has gotten thicker. Dimpling. Open sores (ulcers). Look for changes in your nipples, such as: Fluid coming out of a nipple. Fluid around a nipple. Bleeding. Dimpling. Redness. A nipple that looks pushed in (retracted), or that has changed position. Feel for changes Lie on your back. Feel each breast. To do this: Pick a breast to feel. Place a pillow under the shoulder closest to that breast. Put the arm closest to that breast behind your head. Feel the nipple area of that breast using the hand of your other arm. Feel the area with the pads of your three middle fingers by making small circles with your fingers. Use light, medium, and firm pressure. Continue the overlapping circles, moving downward over the breast. Keep making circles with your fingers. Stop when you feel your ribs. Start making circles with your fingers again, this time going   upward until you reach your collarbone. Then, make circles outward across your breast and into your armpit area. Squeeze your nipple. Check for discharge and lumps. Repeat these steps to check your other breast. Sit or stand in the tub or shower. With soapy water on your skin, feel each breast the same way you did when you were lying down. Write down what you find Writing down what you find can help you remember what to tell your doctor. Write down: What is normal for each breast. Any changes you find in each breast. These  include: The kind of changes you find. A tender or painful breast. Any lump you find. Write down its size and where it is. When you last had your monthly period (menstrual cycle). General tips If you are breastfeeding, the best time to check your breasts is after you feed your baby or after you use a breast pump. If you get monthly bleeding, the best time to check your breasts is 5-7 days after your monthly cycle ends. With time, you will become comfortable with the self-exam. You will also start to know if there are changes in your breasts. Contact a doctor if: You see a change in the shape or size of your breasts or nipples. You see a change in the skin of your breast or nipples, such as red or scaly skin. You have fluid coming from your nipples that is not normal. You find a new lump or thick area. You have breast pain. You have any concerns about your breast health. Summary Breast self-awareness includes looking for changes in your breasts and feeling for changes within your breasts. You should do breast self-awareness in front of a mirror in a well-lit room. If you get monthly periods (menstrual cycles), the best time to check your breasts is 5-7 days after your period ends. Tell your doctor about any changes you see in your breasts. Changes include changes in size, changes on the skin, painful or tender breasts, or fluid from your nipples that is not normal. This information is not intended to replace advice given to you by your health care provider. Make sure you discuss any questions you have with your health care provider. Document Revised: 10/01/2021 Document Reviewed: 02/26/2021 Elsevier Patient Education  2024 Elsevier Inc.  

## 2022-12-07 ENCOUNTER — Encounter: Payer: Self-pay | Admitting: Obstetrics and Gynecology

## 2022-12-07 ENCOUNTER — Ambulatory Visit (INDEPENDENT_AMBULATORY_CARE_PROVIDER_SITE_OTHER): Payer: BC Managed Care – PPO | Admitting: Obstetrics and Gynecology

## 2022-12-07 VITALS — BP 121/88 | HR 68 | Resp 16 | Ht 67.0 in | Wt 180.9 lb

## 2022-12-07 DIAGNOSIS — Z01419 Encounter for gynecological examination (general) (routine) without abnormal findings: Secondary | ICD-10-CM

## 2022-12-07 DIAGNOSIS — E785 Hyperlipidemia, unspecified: Secondary | ICD-10-CM

## 2022-12-07 DIAGNOSIS — Z803 Family history of malignant neoplasm of breast: Secondary | ICD-10-CM

## 2022-12-07 DIAGNOSIS — Z124 Encounter for screening for malignant neoplasm of cervix: Secondary | ICD-10-CM | POA: Diagnosis not present

## 2022-12-07 DIAGNOSIS — Z975 Presence of (intrauterine) contraceptive device: Secondary | ICD-10-CM

## 2022-12-07 NOTE — Addendum Note (Signed)
Addended by: Santiago Bumpers L on: 12/07/2022 05:00 PM   Modules accepted: Orders

## 2023-01-03 DIAGNOSIS — G4700 Insomnia, unspecified: Secondary | ICD-10-CM | POA: Diagnosis not present

## 2023-01-03 DIAGNOSIS — F411 Generalized anxiety disorder: Secondary | ICD-10-CM | POA: Diagnosis not present

## 2023-01-03 DIAGNOSIS — F330 Major depressive disorder, recurrent, mild: Secondary | ICD-10-CM | POA: Diagnosis not present

## 2023-02-14 ENCOUNTER — Other Ambulatory Visit: Payer: Self-pay | Admitting: Primary Care

## 2023-02-14 DIAGNOSIS — G43009 Migraine without aura, not intractable, without status migrainosus: Secondary | ICD-10-CM

## 2023-02-14 MED ORDER — SUMATRIPTAN SUCCINATE 100 MG PO TABS: ORAL_TABLET | ORAL | 0 refills | Status: DC

## 2023-03-08 ENCOUNTER — Ambulatory Visit: Payer: BC Managed Care – PPO | Admitting: Primary Care

## 2023-04-05 DIAGNOSIS — F411 Generalized anxiety disorder: Secondary | ICD-10-CM | POA: Diagnosis not present

## 2023-04-05 DIAGNOSIS — G4700 Insomnia, unspecified: Secondary | ICD-10-CM | POA: Diagnosis not present

## 2023-04-05 DIAGNOSIS — F330 Major depressive disorder, recurrent, mild: Secondary | ICD-10-CM | POA: Diagnosis not present

## 2023-06-21 ENCOUNTER — Other Ambulatory Visit: Payer: Self-pay | Admitting: Primary Care

## 2023-06-21 DIAGNOSIS — G43009 Migraine without aura, not intractable, without status migrainosus: Secondary | ICD-10-CM

## 2023-06-22 NOTE — Telephone Encounter (Signed)
Patient is due for CPE/follow up in mid April, this will be required prior to any further refills.  Please schedule, thank you!

## 2023-06-22 NOTE — Telephone Encounter (Signed)
LVM for patient to c/b and schedule.

## 2023-06-28 DIAGNOSIS — F411 Generalized anxiety disorder: Secondary | ICD-10-CM | POA: Diagnosis not present

## 2023-06-28 DIAGNOSIS — F330 Major depressive disorder, recurrent, mild: Secondary | ICD-10-CM | POA: Diagnosis not present

## 2023-06-28 DIAGNOSIS — G4700 Insomnia, unspecified: Secondary | ICD-10-CM | POA: Diagnosis not present

## 2023-07-20 ENCOUNTER — Other Ambulatory Visit: Payer: Self-pay | Admitting: Primary Care

## 2023-07-20 DIAGNOSIS — R519 Headache, unspecified: Secondary | ICD-10-CM

## 2023-07-20 MED ORDER — PROPRANOLOL HCL ER 80 MG PO CP24
80.0000 mg | ORAL_CAPSULE | Freq: Every day | ORAL | 0 refills | Status: DC
Start: 2023-07-20 — End: 2023-10-02

## 2023-09-01 ENCOUNTER — Encounter: Payer: Self-pay | Admitting: Primary Care

## 2023-09-01 ENCOUNTER — Ambulatory Visit: Admitting: Primary Care

## 2023-09-01 VITALS — BP 120/78 | HR 83 | Temp 98.1°F | Ht 68.0 in | Wt 179.5 lb

## 2023-09-01 DIAGNOSIS — F418 Other specified anxiety disorders: Secondary | ICD-10-CM | POA: Diagnosis not present

## 2023-09-01 DIAGNOSIS — E785 Hyperlipidemia, unspecified: Secondary | ICD-10-CM

## 2023-09-01 DIAGNOSIS — R519 Headache, unspecified: Secondary | ICD-10-CM

## 2023-09-01 DIAGNOSIS — G43009 Migraine without aura, not intractable, without status migrainosus: Secondary | ICD-10-CM

## 2023-09-01 DIAGNOSIS — Z1211 Encounter for screening for malignant neoplasm of colon: Secondary | ICD-10-CM

## 2023-09-01 NOTE — Assessment & Plan Note (Signed)
Improved. Continue propranolol ER 80 mg daily

## 2023-09-01 NOTE — Assessment & Plan Note (Signed)
 Repeat lipid panel pending today.  We discussed to work on weight loss through healthy diet and exercise. Given family history of heart attack in father in his 57s, coupled with her high cholesterol, we will obtain CT coronary calcium score.  She agrees.  Orders placed.

## 2023-09-01 NOTE — Assessment & Plan Note (Signed)
 But overall improved.  We discussed the dose increase of propranolol  to 120 mg daily to see if this would help with prevention.  She kindly declines for now but will update if she changes her mind.  Continue propranolol  ER 80 mg daily for migraine/headache prevention.. Continue sumatriptan  100 mg.  For migraine abortion.

## 2023-09-01 NOTE — Patient Instructions (Signed)
 Stop by the lab prior to leaving today. I will notify you of your results once received.   You will receive a phone call regarding the CT scan and the colonoscopy.  It was a pleasure to see you today!

## 2023-09-01 NOTE — Assessment & Plan Note (Signed)
 Stable. Following with psychiatry.  Continue duloxetine 60 mg daily, sertraline 200 mg daily, alprazolam 0.5 mg as needed.

## 2023-09-01 NOTE — Progress Notes (Signed)
 Subjective:    Patient ID: Whitney [name], female    DOB: 07/15/1970, 53 y.o.   MRN: [id number]  HPI  Whitney [name] is a very pleasant 53 y.o. female with a history of migraine, depression, anxiety, fatigue, hyperlipidemia who presents today for follow-up of chronic conditions and medication refills.  1) Anxiety and Depression: Currently managed on Cymbalta 60 mg daily, sertraline 200 mg daily, alprazolam 0.5 mg as needed.  Following with psychiatry. Feels as though her regimen helps. She feels better than she did prior to this regimen.   2) Migraines/Headaches: Currently managed on propranolol  ER 80 mg daily for headache prevention and sumatriptan  100 mg as needed for migraine abortion.  She continues to experience migraines which occur once monthly on average or during weather/seasonal changes. She's had to use Imitrex  once monthly which helps to abort migraine. Overall she's felt an improvement on propranolol  as headaches have abated and migraine frequency has reduced.   3) Hyperlipidemia: Chronic.  Not currently managed on treatment.  LDL from April 2024 of 185, HDL 46.  Lipoprotein a lab was negative.   She denies chest pain, shortness of breath.   Wt Readings from Last 3 Encounters:  09/01/23 179 lb 8 oz (81.4 kg)  12/07/22 180 lb 14.4 oz (82.1 kg)  08/17/22 175 lb (79.4 kg)     Review of Systems  Respiratory:  Negative for shortness of breath.   Cardiovascular:  Negative for chest pain.  Gastrointestinal:  Negative for constipation and diarrhea.  Neurological:  Negative for dizziness and headaches.         Past Medical History:  Diagnosis Date   Anxiety    Depression    Frequent headaches    Hx of migraine headaches    Migraines    Urinary tract infection     Social History   Socioeconomic History   Marital status: Married    Spouse name: Not on file   Number of children: Not on file   Years of education: Not on file   Highest education level:  12th grade  Occupational History   Not on file  Tobacco Use   Smoking status: Never   Smokeless tobacco: Never  Vaping Use   Vaping status: Never Used  Substance and Sexual Activity   Alcohol use: No    Alcohol/week: 0.0 standard drinks of alcohol   Drug use: No   Sexual activity: Yes    Partners: Male    Birth control/protection: Surgical, I.U.D.    Comment: Husband had vasectomy   Other Topics Concern   Not on file  Social History Narrative   Works at [employer] as a Psychologist, forensic   Lives with her husband and 2 children.   Caffeine - 2 cups of tea.   Enjoys spending time with family.   Social Drivers of Health   Financial Resource Strain: High Risk (08/31/2023)   Overall Financial Resource Strain (CARDIA)    Difficulty of Paying Living Expenses: Hard  Food Insecurity: Food Insecurity Present (08/31/2023)   Hunger Vital Sign    Worried About Running Out of Food in the Last Year: Sometimes true    Ran Out of Food in the Last Year: Sometimes true  Transportation Needs: No Transportation Needs (08/31/2023)   PRAPARE - Administrator, Civil Service (Medical): No    Lack of Transportation (Non-Medical): No  Physical Activity: Insufficiently Active (08/31/2023)   Exercise Vital Sign    Days of Exercise per  Week: 3 days    Minutes of Exercise per Session: 30 min  Stress: Stress Concern Present (08/31/2023)   [employer] of Occupational Health - Occupational Stress Questionnaire    Feeling of Stress : To some extent  Social Connections: Moderately Integrated (08/31/2023)   Social Connection and Isolation Panel [NHANES]    Frequency of Communication with Friends and Family: Twice a week    Frequency of Social Gatherings with Friends and Family: Once a week    Attends Religious Services: 1 to 4 times per year    Active Member of [employer] or Organizations: No    Attends Engineer, structural: Not on file    Marital Status: Married  Catering manager  Violence: Not on file    Past Surgical History:  Procedure Laterality Date   BREAST BIOPSY Left 03/04/2015   FIBROCYSTIC CHANGES stereo    Family History  Problem Relation Age of Onset   Breast cancer Mother 61   Diabetes Father    Heart attack Father 61 - 71   Stomach cancer Maternal Grandmother    Breast cancer Maternal Aunt 61       Lung Cancer    No Known Allergies  Current Outpatient Medications on File Prior to Visit  Medication Sig Dispense Refill   ALPRAZolam (XANAX) 0.5 MG tablet Take 0.5 mg by mouth as needed.      DULoxetine (CYMBALTA) 60 MG capsule Take 60 mg by mouth daily.     levonorgestrel  (MIRENA ) 20 MCG/24HR IUD 1 each by Intrauterine route once.     MELATONIN PO Take 1 tablet by mouth at bedtime.     propranolol  ER (INDERAL  LA) 80 MG 24 hr capsule Take 1 capsule (80 mg total) by mouth at bedtime. For headache prevention 90 capsule 0   sertraline (ZOLOFT) 100 MG tablet Take 200 mg by mouth daily.     SUMAtriptan  (IMITREX ) 100 MG tablet TAKE 1 TABLET BY MOUTH AT ONSET OF MIGRAINE. MAY REPEAT IN 2 HOURS IF HEADACHE PERSISTS OR RECURS. 10 tablet 0   No current facility-administered medications on file prior to visit.    BP 120/78   Pulse 83   Temp 98.1 F (36.7 C) (Oral)   Ht 5\' 8"  (1.727 m)   Wt 179 lb 8 oz (81.4 kg)   SpO2 94%   BMI 27.29 kg/m  Objective:   Physical Exam Cardiovascular:     Rate and Rhythm: Normal rate and regular rhythm.  Pulmonary:     Effort: Pulmonary effort is normal.     Breath sounds: Normal breath sounds.  Musculoskeletal:     Cervical back: Neck supple.  Skin:    General: Skin is warm and dry.  Neurological:     Mental Status: She is alert and oriented to person, place, and time.  Psychiatric:        Mood and Affect: Mood normal.           Assessment & Plan:  Hyperlipidemia, unspecified hyperlipidemia type Assessment & Plan: Repeat lipid panel pending today.  We discussed to work on weight loss through  healthy diet and exercise. Given family history of heart attack in father in his 46s, coupled with her high cholesterol, we will obtain CT coronary calcium score.  She agrees.  Orders placed.  Orders: -     Lipid panel -     Comprehensive metabolic panel with GFR -     CT CARDIAC SCORING (SELF PAY ONLY); Future  Screening for  colon cancer -     Ambulatory referral to Gastroenterology  Migraine without aura and without status migrainosus, not intractable Assessment & Plan: But overall improved.  We discussed the dose increase of propranolol  to 120 mg daily to see if this would help with prevention.  She kindly declines for now but will update if she changes her mind.  Continue propranolol  ER 80 mg daily for migraine/headache prevention.. Continue sumatriptan  100 mg.  For migraine abortion.   Depression with anxiety Assessment & Plan: Stable. Following with psychiatry.  Continue duloxetine 60 mg daily, sertraline 200 mg daily, alprazolam 0.5 mg as needed.   Frequent headaches Assessment & Plan: Improved.  Continue propranolol  ER 80 mg daily.         Mirabella Hilario K Nykole Matos, NP

## 2023-09-02 LAB — COMPREHENSIVE METABOLIC PANEL WITH GFR
ALT: 16 U/L (ref 0–35)
AST: 13 U/L (ref 0–37)
Albumin: 4.1 g/dL (ref 3.5–5.2)
Alkaline Phosphatase: 55 U/L (ref 39–117)
BUN: 17 mg/dL (ref 6–23)
CO2: 29 meq/L (ref 19–32)
Calcium: 8.8 mg/dL (ref 8.4–10.5)
Chloride: 101 meq/L (ref 96–112)
Creatinine, Ser: 0.74 mg/dL (ref 0.40–1.20)
GFR: 93.39 mL/min (ref >60.00)
Glucose, Bld: 96 mg/dL (ref 70–99)
Potassium: 4.1 meq/L (ref 3.5–5.1)
Sodium: 137 meq/L (ref 135–145)
Total Bilirubin: 0.5 mg/dL (ref 0.2–1.2)
Total Protein: 6.4 g/dL (ref 6.0–8.3)

## 2023-09-02 LAB — LIPID PANEL
Cholesterol: 210 mg/dL — ABNORMAL HIGH (ref 0–200)
HDL: 44.9 mg/dL (ref >39.00)
LDL Cholesterol: 136 mg/dL — ABNORMAL HIGH (ref 0–99)
NonHDL: 164.66
Total CHOL/HDL Ratio: 5
Triglycerides: 145 mg/dL (ref 0.0–149.0)
VLDL: 29 mg/dL (ref 0.0–40.0)

## 2023-09-07 ENCOUNTER — Ambulatory Visit: Admitting: Primary Care

## 2023-09-07 ENCOUNTER — Ambulatory Visit
Admission: RE | Admit: 2023-09-07 | Discharge: 2023-09-07 | Disposition: A | Discharge status: Home / Self Care | Source: Ambulatory Visit | Attending: Primary Care | Admitting: Primary Care

## 2023-09-07 ENCOUNTER — Other Ambulatory Visit: Payer: Self-pay | Admitting: Primary Care

## 2023-09-07 ENCOUNTER — Ambulatory Visit: Payer: Self-pay

## 2023-09-07 ENCOUNTER — Encounter: Payer: Self-pay | Admitting: Primary Care

## 2023-09-07 VITALS — BP 122/84 | HR 117 | Temp 101.0°F | Ht 68.0 in | Wt 175.2 lb

## 2023-09-07 DIAGNOSIS — R110 Nausea: Secondary | ICD-10-CM | POA: Diagnosis not present

## 2023-09-07 DIAGNOSIS — N3001 Acute cystitis with hematuria: Secondary | ICD-10-CM | POA: Diagnosis not present

## 2023-09-07 DIAGNOSIS — R109 Unspecified abdominal pain: Secondary | ICD-10-CM | POA: Insufficient documentation

## 2023-09-07 DIAGNOSIS — R10A0 Flank pain, unspecified side: Secondary | ICD-10-CM

## 2023-09-07 DIAGNOSIS — N134 Hydroureter: Secondary | ICD-10-CM | POA: Diagnosis not present

## 2023-09-07 DIAGNOSIS — R3915 Urgency of urination: Secondary | ICD-10-CM | POA: Diagnosis not present

## 2023-09-07 DIAGNOSIS — N132 Hydronephrosis with renal and ureteral calculous obstruction: Secondary | ICD-10-CM | POA: Diagnosis not present

## 2023-09-07 DIAGNOSIS — N200 Calculus of kidney: Secondary | ICD-10-CM

## 2023-09-07 DIAGNOSIS — N10 Acute tubulo-interstitial nephritis: Secondary | ICD-10-CM

## 2023-09-07 DIAGNOSIS — R509 Fever, unspecified: Secondary | ICD-10-CM | POA: Diagnosis not present

## 2023-09-07 LAB — POC URINALSYSI DIPSTICK (AUTOMATED)
Bilirubin, UA: NEGATIVE
Blood, UA: 3
Glucose, UA: NEGATIVE
Ketones, UA: NEGATIVE
Nitrite, UA: NEGATIVE
Protein, UA: POSITIVE — AB
Spec Grav, UA: 1.01 (ref 1.010–1.025)
Urobilinogen, UA: 0.2 U/dL
pH, UA: 8 (ref 5.0–8.0)

## 2023-09-07 MED ORDER — IOHEXOL 300 MG/ML  SOLN
100.0000 mL | Freq: Once | INTRAMUSCULAR | Status: AC | PRN
  Administered 2023-09-07: 100 mL via INTRAVENOUS

## 2023-09-07 MED ORDER — ONDANSETRON 4 MG PO TBDP: 4.0000 mg | ORAL_TABLET | Freq: Three times a day (TID) | ORAL | 0 refills | Status: AC | PRN

## 2023-09-07 MED ORDER — TAMSULOSIN HCL 0.4 MG PO CAPS: 0.4000 mg | ORAL_CAPSULE | Freq: Every day | ORAL | 0 refills | Status: AC

## 2023-09-07 MED ORDER — CIPROFLOXACIN HCL 500 MG PO TABS: 500.0000 mg | ORAL_TABLET | Freq: Two times a day (BID) | ORAL | 0 refills | Status: AC

## 2023-09-07 NOTE — Progress Notes (Signed)
 Subjective:    Patient ID: Whitney [name], female    DOB: 12-01-1970, 53 y.o.   MRN: [id number]  Flank Pain Associated symptoms include a fever and headaches. Pertinent negatives include no dysuria.  Headache  Associated symptoms include a fever and nausea. Pertinent negatives include no vomiting.    Whitney [name] is a very pleasant 53 y.o. female with a history of migraines, constipation, menorrhagia with regular cycle, iron deficiency anemia who presents today to discuss abdominal pain.   Symptom onset yesterday with left lateral flank pain with radiation to left mid to lower back and now around to left groin. She then developed nausea, urinary urgency, bilateral pelvic pressure, body aches. She had a hard time sleeping due to her pain which is intermittent and achy. She has no pain right now, but had pain on her car ride to the office. She cannot really provoke her pain.  She denies dysuria, hematuria, vomiting, history of kidney stones, pyelonephritis, injury/trauma. She does have a history of UTI.   Review of Systems  Constitutional:  Positive for chills and fever.  Gastrointestinal:  Positive for nausea. Negative for constipation, diarrhea and vomiting.  Genitourinary:  Positive for flank pain and urgency. Negative for difficulty urinating, dysuria, frequency and hematuria.  Neurological:  Positive for headaches.         Past Medical History:  Diagnosis Date   Anxiety    Depression    Frequent headaches    Hx of migraine headaches    Migraines    Urinary tract infection     Social History   Socioeconomic History   Marital status: Married    Spouse name: Not on file   Number of children: Not on file   Years of education: Not on file   Highest education level: 12th grade  Occupational History   Not on file  Tobacco Use   Smoking status: Never   Smokeless tobacco: Never  Vaping Use   Vaping status: Never Used  Substance and Sexual Activity   Alcohol  use: No    Alcohol/week: 0.0 standard drinks of alcohol   Drug use: No   Sexual activity: Yes    Partners: Male    Birth control/protection: Surgical, I.U.D.    Comment: Husband had vasectomy   Other Topics Concern   Not on file  Social History Narrative   Works at [employer] as a Psychologist, forensic   Lives with her husband and 2 children.   Caffeine - 2 cups of tea.   Enjoys spending time with family.   Social Drivers of Health   Financial Resource Strain: High Risk (08/31/2023)   Overall Financial Resource Strain (CARDIA)    Difficulty of Paying Living Expenses: Hard  Food Insecurity: Food Insecurity Present (08/31/2023)   Hunger Vital Sign    Worried About Running Out of Food in the Last Year: Sometimes true    Ran Out of Food in the Last Year: Sometimes true  Transportation Needs: No Transportation Needs (08/31/2023)   PRAPARE - Administrator, Civil Service (Medical): No    Lack of Transportation (Non-Medical): No  Physical Activity: Insufficiently Active (08/31/2023)   Exercise Vital Sign    Days of Exercise per Week: 3 days    Minutes of Exercise per Session: 30 min  Stress: Stress Concern Present (08/31/2023)   [employer] of Occupational Health - Occupational Stress Questionnaire    Feeling of Stress : To some extent  Social Connections: Moderately  Integrated (08/31/2023)   Social Connection and Isolation Panel [NHANES]    Frequency of Communication with Friends and Family: Twice a week    Frequency of Social Gatherings with Friends and Family: Once a week    Attends Religious Services: 1 to 4 times per year    Active Member of [employer] or Organizations: No    Attends Engineer, structural: Not on file    Marital Status: Married  Catering manager Violence: Not on file    Past Surgical History:  Procedure Laterality Date   BREAST BIOPSY Left 03/04/2015   FIBROCYSTIC CHANGES stereo    Family History  Problem Relation Age of Onset   Breast  cancer Mother 75   Diabetes Father    Heart attack Father 36 - 21   Stomach cancer Maternal Grandmother    Breast cancer Maternal Aunt 61       Lung Cancer    No Known Allergies  Current Outpatient Medications on File Prior to Visit  Medication Sig Dispense Refill   ALPRAZolam (XANAX) 0.5 MG tablet Take 0.5 mg by mouth as needed.      DULoxetine (CYMBALTA) 60 MG capsule Take 60 mg by mouth daily.     levonorgestrel  (MIRENA ) 20 MCG/24HR IUD 1 each by Intrauterine route once.     MELATONIN PO Take 1 tablet by mouth at bedtime.     propranolol  ER (INDERAL  LA) 80 MG 24 hr capsule Take 1 capsule (80 mg total) by mouth at bedtime. For headache prevention 90 capsule 0   sertraline (ZOLOFT) 100 MG tablet Take 200 mg by mouth daily.     SUMAtriptan  (IMITREX ) 100 MG tablet TAKE 1 TABLET BY MOUTH AT ONSET OF MIGRAINE. MAY REPEAT IN 2 HOURS IF HEADACHE PERSISTS OR RECURS. 10 tablet 0   No current facility-administered medications on file prior to visit.    BP 122/84   Pulse (!) 117   Temp (!) 101 F (38.3 C) (Oral)   Ht 5\' 8"  (1.727 m)   Wt 175 lb 3.2 oz (79.5 kg)   SpO2 98%   BMI 26.64 kg/m  Objective:   Physical Exam Constitutional:      Appearance: She is ill-appearing.  Cardiovascular:     Rate and Rhythm: Regular rhythm. Tachycardia present.  Pulmonary:     Effort: Pulmonary effort is normal.     Breath sounds: Normal breath sounds.  Abdominal:     Palpations: Abdomen is soft.     Tenderness: There is no abdominal tenderness. There is no right CVA tenderness or left CVA tenderness.  Musculoskeletal:     Cervical back: Neck supple.  Skin:    General: Skin is warm and dry.  Neurological:     Mental Status: She is alert and oriented to person, place, and time.  Psychiatric:        Mood and Affect: Mood normal.           Assessment & Plan:  Flank pain Assessment & Plan: Differentials include renal stone versus acute pyelonephritis is acute cystitis.  Urinalysis  with 2+ leuks, negative nitrites, 3+ blood. Urine culture ordered and pending.  Stat CT abdomen pelvis ordered and pending for further evaluation. CBC with differential and BMP pending.  Given fevers, coupled with presentation, will treat for potential pyelonephritis/cystitis.  Start ciprofloxacin  500 mg twice daily x 7 days. Start Zofran 4 mg as needed.  We discussed the importance of proper hydration with water.  ED precautions provided.  Await results.  Orders: -     Urine Culture -     CT ABDOMEN PELVIS W CONTRAST; Future -     Ciprofloxacin  HCl; Take 1 tablet (500 mg total) by mouth 2 (two) times daily for 7 days.  Dispense: 14 tablet; Refill: 0 -     CBC with Differential/Platelet -     Basic metabolic panel with GFR  Urinary urgency -     POCT Urinalysis Dipstick (Automated) -     Urine Culture -     CT ABDOMEN PELVIS W CONTRAST; Future -     Ciprofloxacin  HCl; Take 1 tablet (500 mg total) by mouth 2 (two) times daily for 7 days.  Dispense: 14 tablet; Refill: 0  Nausea -     Ondansetron; Take 1 tablet (4 mg total) by mouth every 8 (eight) hours as needed for nausea or vomiting.  Dispense: 20 tablet; Refill: 0 -     CBC with Differential/Platelet -     Basic metabolic panel with GFR        Gabriel John, NP

## 2023-09-07 NOTE — Telephone Encounter (Signed)
 Called patient moved to PCP at 300. She will call if any questions.

## 2023-09-07 NOTE — Telephone Encounter (Signed)
  Chief Complaint: L abd pain radiates to the back  Symptoms: pain, nausea,  Frequency: started yesterday Pertinent Negatives: Patient denies fever Disposition: [] ED /[] Urgent Care (no appt availability in office) / [x] Appointment(In office/virtual)/ []  [place]/ [] Home Care/ [] Refused Recommended Disposition /[] [facility]/ []  Follow-up with PCP Additional Notes: Pt states that she started having L lower abd pain yesterday that radiated to the back. Pt states that she is nauseous and pain level is 3/10. Pt states that she has hx of UTIs but this doesn't feel entirely they same. Pt states that she is having pressure when voiding. Pt scheduled today.  Copied from CRM [id number]. Topic: Clinical - Red Word Triage >> Sep 07, 2023  8:32 AM Dewanda Foots wrote: Red Word that prompted transfer to Nurse Triage: Pain-lower left back, possible UTI, Nausea Reason for Disposition  [1] MODERATE pain (e.g., interferes with normal activities) AND [2] pain comes and goes (cramps) AND [3] present > 24 hours  (Exception: Pain with Vomiting or Diarrhea - see that Guideline.)  Answer Assessment - Initial Assessment Questions 1. LOCATION: "Where does it hurt?"      L abd pain 2. RADIATION: "Does the pain shoot anywhere else?" (e.g., chest, back)     L back 3. ONSET: "When did the pain begin?" (e.g., minutes, hours or days ago)      yesterday 4. SUDDEN: "Gradual or sudden onset?"     gradual 5. PATTERN "Does the pain come and go, or is it constant?"    - If it comes and goes: "How long does it last?" "Do you have pain now?"     (Note: Comes and goes means the pain is intermittent. It goes away completely between bouts.)    - If constant: "Is it getting better, staying the same, or getting worse?"      (Note: Constant means the pain never goes away completely; most serious pain is constant and gets worse.)      intermittent 6. SEVERITY: "How bad is the pain?"  (e.g., Scale 1-10; mild,  moderate, or severe)    - MILD (1-3): Doesn't interfere with normal activities, abdomen soft and not tender to touch.     - MODERATE (4-7): Interferes with normal activities or awakens from sleep, abdomen tender to touch.     - SEVERE (8-10): Excruciating pain, doubled over, unable to do any normal activities.       3 right now, but the nausea is worse 7. RECURRENT SYMPTOM: "Have you ever had this type of stomach pain before?" If Yes, ask: "When was the last time?" and "What happened that time?"      denies 8. CAUSE: "What do you think is causing the stomach pain?"     Unsure, Pt states she feels like a UTI, 9. RELIEVING/AGGRAVATING FACTORS: "What makes it better or worse?" (e.g., antacids, bending or twisting motion, bowel movement)     denies 10. OTHER SYMPTOMS: "Do you have any other symptoms?" (e.g., back pain, diarrhea, fever, urination pain, vomiting)       Pressure when urinating, feels like she has to pee  Protocols used: Abdominal Pain - [facility]

## 2023-09-07 NOTE — Telephone Encounter (Signed)
 If she wants to see a female provider, it looks like Polly Brink (PCP) may have an opening at 3?  ([place] rescheduled already)  I am completely fine seeing her, but if she would like a female provider, this may be an option.

## 2023-09-07 NOTE — Telephone Encounter (Signed)
 Noted, will evaluate.

## 2023-09-07 NOTE — Assessment & Plan Note (Signed)
 Differentials include renal stone versus acute pyelonephritis is acute cystitis.  Urinalysis with 2+ leuks, negative nitrites, 3+ blood. Urine culture ordered and pending.  Stat CT abdomen pelvis ordered and pending for further evaluation. CBC with differential and BMP pending.  Given fevers, coupled with presentation, will treat for potential pyelonephritis/cystitis.  Start ciprofloxacin  500 mg twice daily x 7 days. Start Zofran 4 mg as needed.  We discussed the importance of proper hydration with water.  ED precautions provided.  Await results.

## 2023-09-07 NOTE — Patient Instructions (Signed)
 Start ciprofloxacin  antibiotics for presumed infection.  Take 1 tablet by mouth twice daily.  You may take the ondansetron antinausea pill every 8 hours as needed.  Stop by the lab prior to leaving today. I will notify you of your results once received.   Push intake of water as tolerated.  Someone will call you regarding the CT scan today.  I will be in touch again soon!

## 2023-09-08 LAB — BASIC METABOLIC PANEL WITH GFR
BUN: 16 mg/dL (ref 6–23)
CO2: 28 meq/L (ref 19–32)
Calcium: 8.8 mg/dL (ref 8.4–10.5)
Chloride: 100 meq/L (ref 96–112)
Creatinine, Ser: 0.72 mg/dL (ref 0.40–1.20)
GFR: 96.51 mL/min
Glucose, Bld: 132 mg/dL — ABNORMAL HIGH (ref 70–99)
Potassium: 3.9 meq/L (ref 3.5–5.1)
Sodium: 135 meq/L (ref 135–145)

## 2023-09-08 LAB — CBC WITH DIFFERENTIAL/PLATELET
Basophils Absolute: 0 K/uL (ref 0.0–0.1)
Basophils Relative: 0.1 % (ref 0.0–3.0)
Eosinophils Absolute: 0 K/uL (ref 0.0–0.7)
Eosinophils Relative: 0.1 % (ref 0.0–5.0)
HCT: 42.3 % (ref 36.0–46.0)
Hemoglobin: 14.2 g/dL (ref 12.0–15.0)
Lymphocytes Relative: 6.7 % — ABNORMAL LOW (ref 12.0–46.0)
Lymphs Abs: 0.8 K/uL (ref 0.7–4.0)
MCHC: 33.6 g/dL (ref 30.0–36.0)
MCV: 94.4 fl (ref 78.0–100.0)
Monocytes Absolute: 0.9 K/uL (ref 0.1–1.0)
Monocytes Relative: 8.1 % (ref 3.0–12.0)
Neutro Abs: 9.8 K/uL — ABNORMAL HIGH (ref 1.4–7.7)
Neutrophils Relative %: 85 % — ABNORMAL HIGH (ref 43.0–77.0)
Platelets: 204 K/uL (ref 150.0–400.0)
RBC: 4.48 Mil/uL (ref 3.87–5.11)
RDW: 12.5 % (ref 11.5–15.5)
WBC: 11.5 K/uL — ABNORMAL HIGH (ref 4.0–10.5)

## 2023-09-10 LAB — URINE CULTURE
MICRO NUMBER:: 16395194
SPECIMEN QUALITY:: ADEQUATE

## 2023-09-11 ENCOUNTER — Other Ambulatory Visit: Payer: Self-pay | Admitting: Primary Care

## 2023-09-11 DIAGNOSIS — N3001 Acute cystitis with hematuria: Secondary | ICD-10-CM

## 2023-09-11 MED ORDER — SULFAMETHOXAZOLE-TRIMETHOPRIM 800-160 MG PO TABS: 1.0000 | ORAL_TABLET | Freq: Two times a day (BID) | ORAL | 0 refills | Status: AC

## 2023-09-22 DIAGNOSIS — G4700 Insomnia, unspecified: Secondary | ICD-10-CM | POA: Diagnosis not present

## 2023-09-22 DIAGNOSIS — F330 Major depressive disorder, recurrent, mild: Secondary | ICD-10-CM | POA: Diagnosis not present

## 2023-09-22 DIAGNOSIS — F411 Generalized anxiety disorder: Secondary | ICD-10-CM | POA: Diagnosis not present

## 2023-10-02 ENCOUNTER — Other Ambulatory Visit: Payer: Self-pay | Admitting: Primary Care

## 2023-10-02 DIAGNOSIS — R519 Headache, unspecified: Secondary | ICD-10-CM

## 2023-10-10 ENCOUNTER — Other Ambulatory Visit: Payer: Self-pay | Admitting: Primary Care

## 2023-10-10 DIAGNOSIS — G43009 Migraine without aura, not intractable, without status migrainosus: Secondary | ICD-10-CM

## 2023-10-11 ENCOUNTER — Ambulatory Visit: Admitting: Urology

## 2023-10-31 ENCOUNTER — Inpatient Hospital Stay: Admission: RE | Admit: 2023-10-31 | Source: Ambulatory Visit

## 2023-11-02 ENCOUNTER — Ambulatory Visit
Admission: RE | Admit: 2023-11-02 | Discharge: 2023-11-02 | Disposition: A | Discharge status: Home / Self Care | Payer: Self-pay | Source: Ambulatory Visit | Attending: Primary Care | Admitting: Primary Care

## 2023-11-02 DIAGNOSIS — E785 Hyperlipidemia, unspecified: Secondary | ICD-10-CM | POA: Insufficient documentation

## 2023-11-04 ENCOUNTER — Ambulatory Visit: Payer: Self-pay | Admitting: Primary Care

## 2023-11-07 ENCOUNTER — Other Ambulatory Visit: Payer: Self-pay | Admitting: Primary Care

## 2023-11-07 DIAGNOSIS — Z1231 Encounter for screening mammogram for malignant neoplasm of breast: Secondary | ICD-10-CM

## 2023-11-30 ENCOUNTER — Other Ambulatory Visit: Payer: Self-pay | Admitting: Primary Care

## 2023-11-30 ENCOUNTER — Ambulatory Visit
Admission: RE | Admit: 2023-11-30 | Discharge: 2023-11-30 | Disposition: A | Discharge status: Home / Self Care | Source: Ambulatory Visit | Attending: Primary Care | Admitting: Primary Care

## 2023-11-30 DIAGNOSIS — Z1231 Encounter for screening mammogram for malignant neoplasm of breast: Secondary | ICD-10-CM | POA: Insufficient documentation

## 2023-11-30 DIAGNOSIS — N6320 Unspecified lump in the left breast, unspecified quadrant: Secondary | ICD-10-CM

## 2023-12-05 ENCOUNTER — Ambulatory Visit
Admission: RE | Admit: 2023-12-05 | Discharge: 2023-12-05 | Disposition: A | Discharge status: Home / Self Care | Source: Ambulatory Visit | Attending: Primary Care | Admitting: Primary Care

## 2023-12-05 ENCOUNTER — Ambulatory Visit: Payer: Self-pay | Admitting: Primary Care

## 2023-12-05 ENCOUNTER — Ambulatory Visit
Admission: RE | Admit: 2023-12-05 | Discharge: 2023-12-05 | Disposition: A | Discharge status: Home / Self Care | Source: Ambulatory Visit | Attending: Primary Care

## 2023-12-05 DIAGNOSIS — R92343 Mammographic extreme density, bilateral breasts: Secondary | ICD-10-CM | POA: Diagnosis not present

## 2023-12-05 DIAGNOSIS — N6320 Unspecified lump in the left breast, unspecified quadrant: Secondary | ICD-10-CM | POA: Diagnosis not present

## 2023-12-05 DIAGNOSIS — R921 Mammographic calcification found on diagnostic imaging of breast: Secondary | ICD-10-CM | POA: Diagnosis not present

## 2023-12-05 DIAGNOSIS — N6321 Unspecified lump in the left breast, upper outer quadrant: Secondary | ICD-10-CM | POA: Diagnosis not present

## 2023-12-05 DIAGNOSIS — N6002 Solitary cyst of left breast: Secondary | ICD-10-CM | POA: Diagnosis not present

## 2023-12-08 ENCOUNTER — Encounter: Admitting: Primary Care

## 2023-12-19 DIAGNOSIS — F411 Generalized anxiety disorder: Secondary | ICD-10-CM | POA: Diagnosis not present

## 2023-12-19 DIAGNOSIS — F330 Major depressive disorder, recurrent, mild: Secondary | ICD-10-CM | POA: Diagnosis not present

## 2023-12-19 DIAGNOSIS — G4700 Insomnia, unspecified: Secondary | ICD-10-CM | POA: Diagnosis not present

## 2024-03-19 DIAGNOSIS — Z1211 Encounter for screening for malignant neoplasm of colon: Secondary | ICD-10-CM | POA: Diagnosis not present

## 2024-04-01 ENCOUNTER — Other Ambulatory Visit: Payer: Self-pay | Admitting: Primary Care

## 2024-04-01 DIAGNOSIS — G43009 Migraine without aura, not intractable, without status migrainosus: Secondary | ICD-10-CM

## 2024-04-17 DIAGNOSIS — G4700 Insomnia, unspecified: Secondary | ICD-10-CM | POA: Diagnosis not present

## 2024-04-17 DIAGNOSIS — F411 Generalized anxiety disorder: Secondary | ICD-10-CM | POA: Diagnosis not present

## 2024-04-17 DIAGNOSIS — F330 Major depressive disorder, recurrent, mild: Secondary | ICD-10-CM | POA: Diagnosis not present

## 2024-04-18 ENCOUNTER — Ambulatory Visit: Payer: Self-pay

## 2024-04-18 DIAGNOSIS — D123 Benign neoplasm of transverse colon: Secondary | ICD-10-CM | POA: Diagnosis not present

## 2024-04-18 DIAGNOSIS — D1399 Benign neoplasm of ill-defined sites within the digestive system: Secondary | ICD-10-CM | POA: Diagnosis not present

## 2024-04-18 DIAGNOSIS — K5730 Diverticulosis of large intestine without perforation or abscess without bleeding: Secondary | ICD-10-CM | POA: Diagnosis not present

## 2024-04-18 DIAGNOSIS — Z1211 Encounter for screening for malignant neoplasm of colon: Secondary | ICD-10-CM | POA: Diagnosis not present

## 2024-05-16 ENCOUNTER — Encounter: Payer: Self-pay | Admitting: Cardiovascular Disease
